# Patient Record
Sex: Male | Born: 1945 | Race: White | Hispanic: No | Smoking: Current every day smoker
Health system: Southern US, Community
[De-identification: ages and names within clinical notes are randomized; demographics above are authoritative.]

## PROBLEM LIST (undated history)

## (undated) DIAGNOSIS — J13 Pneumonia due to Streptococcus pneumoniae: Secondary | ICD-10-CM

## (undated) DIAGNOSIS — I10 Essential (primary) hypertension: Secondary | ICD-10-CM

## (undated) DIAGNOSIS — J40 Bronchitis, not specified as acute or chronic: Secondary | ICD-10-CM

## (undated) DIAGNOSIS — N179 Acute kidney failure, unspecified: Secondary | ICD-10-CM

## (undated) DIAGNOSIS — K219 Gastro-esophageal reflux disease without esophagitis: Secondary | ICD-10-CM

## (undated) DIAGNOSIS — K56609 Unspecified intestinal obstruction, unspecified as to partial versus complete obstruction: Secondary | ICD-10-CM

## (undated) DIAGNOSIS — J439 Emphysema, unspecified: Secondary | ICD-10-CM

## (undated) DIAGNOSIS — M1990 Unspecified osteoarthritis, unspecified site: Secondary | ICD-10-CM

## (undated) HISTORY — PX: NECK SURGERY: SHX720

## (undated) HISTORY — PX: INGUINAL HERNIA REPAIR: SUR1180

## (undated) HISTORY — DX: Emphysema, unspecified: J43.9

## (undated) HISTORY — PX: APPENDECTOMY: SHX54

---

## 1999-05-13 ENCOUNTER — Ambulatory Visit (HOSPITAL_BASED_OUTPATIENT_CLINIC_OR_DEPARTMENT_OTHER): Admission: RE | Admit: 1999-05-13 | Discharge: 1999-05-13 | Payer: Self-pay | Admitting: Surgery

## 2001-10-01 ENCOUNTER — Inpatient Hospital Stay (HOSPITAL_COMMUNITY): Admission: EM | Admit: 2001-10-01 | Discharge: 2001-10-04 | Payer: Self-pay | Admitting: Emergency Medicine

## 2001-10-01 ENCOUNTER — Encounter: Payer: Self-pay | Admitting: *Deleted

## 2001-10-03 ENCOUNTER — Encounter: Payer: Self-pay | Admitting: *Deleted

## 2006-07-18 ENCOUNTER — Emergency Department (HOSPITAL_COMMUNITY): Admission: EM | Admit: 2006-07-18 | Discharge: 2006-07-18 | Payer: Self-pay | Admitting: Emergency Medicine

## 2006-09-05 ENCOUNTER — Ambulatory Visit (HOSPITAL_COMMUNITY): Admission: RE | Admit: 2006-09-05 | Discharge: 2006-09-05 | Payer: Self-pay | Admitting: Surgery

## 2007-07-08 ENCOUNTER — Emergency Department (HOSPITAL_COMMUNITY): Admission: EM | Admit: 2007-07-08 | Discharge: 2007-07-08 | Payer: Self-pay | Admitting: Emergency Medicine

## 2007-07-12 ENCOUNTER — Ambulatory Visit (HOSPITAL_COMMUNITY): Admission: RE | Admit: 2007-07-12 | Discharge: 2007-07-12 | Payer: Self-pay | Admitting: Internal Medicine

## 2010-08-09 NOTE — Op Note (Signed)
Brent Good, Brent Good               ACCOUNT NO.:  [id number]   MEDICAL RECORD NO.:  [id number]          PATIENT TYPE:  AMB   LOCATION:  DAY                          FACILITY:  [facility]   PHYSICIAN:  Ardeth Sportsman, MD     DATE OF BIRTH:  1946/03/01   DATE OF PROCEDURE:  09/05/2006  DATE OF DISCHARGE:                               OPERATIVE REPORT   PRIMARY CARE PHYSICIAN:  Dr. Jacqulyn Bath at [facility].   REQUESTING PHYSICIAN:  Donnetta Hutching, MD.   PREOPERATIVE DIAGNOSIS:  Recurrent bilateral inguinal hernias.   POSTOPERATIVE DIAGNOSIS:  Recurrent bilateral inguinal hernias of right  pantaloon and left indirect.   PROCEDURE PERFORMED:  1. Laparoscopic lysis of adhesions x90 minutes (E=75% of the case).  2. Laparoscopic bilateral inguinal hernia repair with 15 x 15 cm      Parietex mesh, 15 x 15 cm for each side.   ANESTHESIA:  1. General anesthesia.  2. Local anesthetic and a field block around all port sites.  3. Bilateral ilioinguinal genitofemoral and cord nerve blocks.   ESTIMATED BLOOD LOSS:  15 ml.   COMPLICATIONS:  No major complications.   SPECIMENS:  None.   DRAINS:  None.   INDICATIONS:  Mr. Brent Good is a 65 year old male, who was seen urgently by  me at the request of Dr. Donnetta Hutching in the [facility] for severe  groin and scrotal pain.  He was found to have bilateral recurrent  inguinal hernias and a large left hydrocele.  Coordination was made with  Dr. Vernie Ammons in [facility], who recommended hydrocelectomy.   The anatomy and embryology abdominal formation testicular migration was  explained.  Past physiology of inguinal herniation was explained with  its risks of incarceration, strangulation, and decompensation to  physical activity.  Options were discussed, and recommendation was made  for a laparoscopic repair and exploration with bilateral inguinal hernia  repair.   Risks such as stroke, heart attack, DVT, thrombosis, pulmonary embolism,  and  death were discussed.  Risks such as bleeding, hematoma, ecchymosis,  need for transfusion, wound infection, abscess, injury to other organs,  testicular compromise of blood supply and/or loss, prolonged pain, nerve  injury, and other risks were discussed.  Questions were answered, and he  agreed to proceed.   OPERATIVE FINDINGS:  He had very dense intraabdominal wall adhesions  from probably prior mesh from his bilateral open inguinal hernia repairs  and his prior ruptured appendicitis surgery.  He had a pantaloon hernia  on the right (direct and indirect recurrent) and an indirect on the  left.  His bladder appeared to be otherwise normal.   DESCRIPTION OF PROCEDURE:  Informed consent was confirmed.  I actually  assisted Dr. Vernie Ammons with the left hydrocelectomy after the patient had  been intubated.  He had sequential compression devices active during the  entire case.  Near the end of the left hydrocelectomy, Dr. Vernie Ammons  placed a Foley catheter under sterile conditions to straight drain.  The  patient had already received IV antibiotics.   The patient was reprepped and draped on his  abdomen and groin in a  sterile fashion.   Entry was gained into the preperitoneal space through the infraumbilical  curvilinear incision.  Blunt dissection was done to help identify the  anterior rectus sheath.  A nick was made just to the right of midline  and the anterior rectus sheath.  In addition, an Hassan port was  attempted to be passed posterior to the right rectus abdominal muscle,  but unfortunately we got into the peritoneal cavity.  I could not get a  working good plane as it seemed to be rather scarred in.  Therefore, I  went in and did an approach from the left preperitoneal plane.  That was  slightly better.  Enough working space was created such that a 5-mm port  could be placed in the left mid abdomen and the right mid abdomen.  Later in the case, I ended up having to place a 5-mm  port in the right  upper quadrant through the peritoneum to help decompress that  capnoperitoneum to allow the capnopreperitoneum to insufflate.  Capnopreperitoneum to 15 mmHg provided pretty good intraabdominal wall  insufflation.   Attention was turned towards the right.  A controlled blunt in light of  focused sharp dissection was used hopefully to free the peritoneum off  the anterior abdominal wall.  The right rectus abdominis muscle was  rather scarred in, but I was able to get the peritoneum off rather  safely.  The peritoneum could be seen crawling up with the cord  structures into the internal inguinal ring.  He also had a large clump  of fat and omentum going into a defect medial to the internal inguinal  ring; this was a direct defect.  The direct hernia defect was  skeletonized and completed reduced.  The hernia sac going into the  internal ring was skeletonized off the cord structures and reduced.  There were some tears of the peritoneum during this process, and these  were closed using #3-0 Vicryl intracorporal laparoscopic suturing to  good effect.   Attention was turned toward the left side.  He had much more dense  adhesions on this left side.  His rectus abdominis muscle and his  inferior epigastric section were more adherent to the peritoneum, but  ultimately I was able, through careful dissection, to free it off the  peritoneum and reflect it anteriorly to help preserve it.  He had very  sharp, dense adhesions on the anterior wall of the hernia sac.  Based on  concerns of avoiding any other injury, I actually ended up  circumferentially dissecting the hernia sac off the normal-appearing  cord structures around the posterior wall of the hernia sac, and then  once that was completely freed out and was assured I did not have any  bowel or ureter or any other organs, I went ahead and sharply cut the  sac as I could not safely reduce it up into the preperitoneal plane.   He  had a ventral hernia on that side.  Once the hernia sac was transected  just before it entered into the internal inguinal ring, I was able to  free peritoneum off the cord structures very nicely posteriorly as well  as laterally.  The transected hernia sac defect was closed using  intracorporeal laparoscopic suturing with #3-0 Vicryl again.   A window was made between the anteriomedial bladder and the true pelvis  wall near the level of the obturator foramen on both sides, and the  peritoneum was freed off the vas deferens slightly as well, but attempt  made to avoid over skeletonization.   Parietex 15 x 15-cm mesh was cut to a half-skull shape for each side,  and each side had mesh placed such that a medial and inferior flap  rested in the true pelvis overlying the obturator foramen.  The mesh  laid well anteriorly, and the anteromedial portal went towards the  midline.  The mesh had at least three inches of circumferential coverage  around the internal inguinal rings bilaterally as well as the direct  hernia defects.  The peritoneum and the grasping lead points were  grasped and elevated anteriorly.  Capnoperitoneum and capnopreperitoneum  were evacuated.  Ports were removed.  The anterior rectus fascial  defects were closed using #0 Vicryl in figure-of-eight stitches.  The  skin was closed using #4-0 Monocryl stitch.  The Foley catheter was  removed.  The patient was extubated and sent to the recovery room in  stable condition.   I explained the operative findings to the patient's family,  postoperative recovery goals and etc. were discussed.  Numerous  questions were answered at least to their satisfaction, and they  expressed understanding and appreciation.  He will follow up with me in  a couple of weeks, sooner if there are any other issues.      Ardeth Sportsman, MD  Electronically Signed     SCG/MEDQ  D:  09/05/2006  T:  09/05/2006  Job:  [id number]   cc:   Donnetta Hutching, MD  [address] [place], [place] [address]

## 2010-08-09 NOTE — Op Note (Signed)
Brent Good, Brent Good               ACCOUNT NO.:  [id number]   MEDICAL RECORD NO.:  [id number]          PATIENT TYPE:  AMB   LOCATION:  DAY                          FACILITY:  [facility]   PHYSICIAN:  Mark C. Vernie Ammons, M.D.  DATE OF BIRTH:  02-22-1946   DATE OF PROCEDURE:  09/05/2006  DATE OF DISCHARGE:                               OPERATIVE REPORT   PREOPERATIVE DIAGNOSIS:  Left hydrocele.   POSTOPERATIVE DIAGNOSIS:  Left hydrocele.   PROCEDURE:  Left hydrocelectomy.   SURGEON:  Mark C. Vernie Ammons, M.D.   ASSISTANT:  Ardeth Sportsman, M.D.   ANESTHESIA:  General anesthesia.   SPECIMENS:  None.   ESTIMATED BLOOD LOSS:  Approximately 15 mL.   DRAINS:  A 16-French Foley catheter.   COMPLICATIONS:  None.   INDICATIONS FOR PROCEDURE:  The patient is a 65 year old white male  patient whom Dr. Michaell Cowing initially saw with an enlarged left hemiscrotum,  and was also found to have bilateral inguinal herniae.  He had a  previous hernia repair in 2001, and when he saw me in the office he was  noted to have a markedly enlarged left hemiscrotum.  A CT revealed that  this was a fluid collection only, no bowel.  I discussed with the  patient the procedure of the hydrocelectomy with its risks,  complications and alternatives.  He understands and elected to proceed.   DESCRIPTION OF PROCEDURE:  After an informed consent the patient brought  to the major operating room and was placed on the operating room table.  Administered general anesthesia.  His genitalia was then sterilely  prepped and draped.  I made a midline median raphe scrotal incision and  then carefully dissected the hydrocele sac from the surrounding tissues  circumferentially.  The hydrocele sac was then incised and 350+ mL of  clear amber fluid were drained from the left hemiscrotum.  I then opened  the sac and noted the testicle to appear normal.  The appendix  epididymis was then identified and fulgurated.  I then excised the  redundant parietal tunica and fulgurated the edges.  I then  reapproximated the edges behind the testicle with running locking #3-0  chromic suture.  The scrotum was visualized and no bleeding points were  noted.  I then closed the scrotum in two layers with a deep layer of  running #3-0 chromic, and the skin was then closed with running #3-0  chromic.  Neosporin, 4 x 4's and fluff Kerlix, as well as a scrotal  support were applied.  A #16 Foley catheter was then placed in the  bladder and hooked to closed system drainage.   The patient tolerated the procedure well.  There were no intraoperative  complications.   Dr. Michaell Cowing then proceeded with a laparoscopic bilateral inguinal  herniorrhaphy which will be dictated separately by him.   DISPOSITION:  I will see the patient back in my office in two weeks for  followup.  He will be given a prescription for Percocet by Dr. Michaell Cowing.      Mark C. Vernie Ammons, M.D.  Electronically Signed  MCO/MEDQ  D:  09/05/2006  T:  09/05/2006  Job:  [id number]   cc:   Ardeth Sportsman, MD  [address] [place] [address]

## 2010-08-12 NOTE — Consult Note (Signed)
[place]. [facility]  Patient:    Brent Good, Brent Good Visit Number: [id number] MRN: [id number]          Service Type: MED Location: [phone] Attending Physician:  Daisey Must Dictated by:   Oley Balm. Sung Amabile, M.D. [facility] Proc. Date: 10/01/01 Admit Date:  10/01/2001   CC:         Daisey Must, M.D. [facility]   Consultation Report  DATE OF BIRTH:  28-May-1945.  REQUESTING PHYSICIAN:  Daisey Must, M.D. [facility]  REASON FOR CONSULTATION:  Pleuritic chest pain and bilateral nodular densities and infiltrates.  HISTORY OF PRESENT ILLNESS:  The patient is a 65 year old gentleman who was in his usual state of good health until 3 days prior to this consultation.  On that Saturday night he developed sudden onset of left-sided pleuritic chest pain.  He also noted chills and shakes.  He documented no fevers.  He noted a rattling cough with white sputum and no hemoptysis.  He was seen that night in the emergency department at [facility] in [place], [place].  It sounds as if he was diagnosed with pleurisy and he was discharged home with ibuprofen and a muscle relaxant.  He was moderately better on the following day but symptoms relapsed on the day prior to this evaluation.  He was seen by a primary care physician, the name of whom he cannot remember.  This physician instructed him to continue the medications that had been prescribed in the emergency department previously.  The patient then presented on the evening of this consultation to the emergency department at [facility] with the above complaints.  He now states that the left-sided pleuritic chest pain has moved to across the midline and to the right side.  It is made worse by coughing and deep inspiration.  His cough is still just productive of white phlegm.  He was initially seen by Dr. Loraine Leriche Pulsipher due to the chest pain. However, Dr. Gerri Spore recognized that this did not represent  cardiac disease and ordered a CT scan of the chest, the results of which are discussed below. Based on the findings of this CT scan he has requested a pulmonary consultation.  PAST MEDICAL HISTORY:  Status post inguinal herniorrhaphy.  No history of major medical problems or major hospitalizations.  He takes no medications on a chronic basis.  SOCIAL HISTORY:  He has no significant occupational or environmental exposures.  He smokes a pack of cigarettes per day and has done so since approximately age 38.  FAMILY HISTORY:  No major illnesses run in his family.  His mother does suffer from Alzheimers disease.  His father is deceased from a self-inflicted gunshot wound.  He has two half sisters who are alive and well.  REVIEW OF SYSTEMS:  This is as per the history of present illness.  He has also had poor appetite over the last 3 days.  He denies significant weight loss.  There is no nausea, vomiting, diarrhea, or dysuria.  There is no hematochezia or melena.  No abdominal pain.  PHYSICAL EXAMINATION:  GENERAL:  He is well-developed, well-nourished, and acutely ill appearing but no overt cardiac or respiratory distress.  He does cough and wince with each deep cough.  VITAL SIGNS:  Temperature is 99.2, blood pressure 126/83, pulse 103, respirations 20, oxygen saturations 93% on room air.  HEENT:  No acute abnormalities.  NECK:  Supple without adenopathy or jugular venous distention.  CHEST:  Few  scattered wheezes and a few rhonchi.  A pleural friction rub is noted in the right base.  No findings of consolidation are seen.  CARDIAC:  Regular rate and rhythm without murmurs.  ABDOMEN:  Soft with normal bowel sounds.  No palpable masses.  EXTREMITIES:  Without clubbing, cyanosis, and edema.  NEUROLOGIC:  No focal deficits.  DATA:  Chest x-ray reveals poor inspiration with what appears to be some bilateral atelectasis.  CT scan of the chest demonstrates multiple  irregular nodular densities.  Most of these are pleural based.  There is a density in the posterior segment of the right upper lobe.  A density in the left upper lobe laterally.  This is abutting the pleura.  There are two densities in the superior segment of the right lower lobe, one oblong and one rounded.  The rounded one is abutting the pleura.  There are two densities low down in the posterior costophrenic angles bilaterally.  The one on the right has more of an infiltrative pneumonic appearance.  There is also some scattered atelectasis in the bases.  No significant adenopathy is noted.  IMPRESSION: 1. Acute onset of pleuritic chest pain 3 days prior to this consultation with    subjective fever and chills. 2. Multiple nodular densities/infiltrates - the radiographic appearance would    be worrisome for the possibility of metastatic carcinoma.  However, the    history really is not suggestive of this with such an abrupt illness of    only 3 days duration.  Therefore, for now I am going to presume that this    is an atypical radiographic appearance of a bacterial pneumonia and I have    formulated the following plan.  In making this assessment, the patient    understands that we might be dealing with pneumonia but we might also be    dealing with metastatic carcinoma.  PLAN: 1. Tequin 400 mg per day for 10 days. 2. Ibuprofen 800 mg t.i.d. until pain is gone. 3. Percocet one to two p.o. q.4-6h. p.r.n. for breakthrough pain. 4. Prilosec 40 mg daily while on the ibuprofen. 5. He is to call my office on the day following this consultation to arrange    followup with my nurse practitioner in 1 week.  A chest x-ray will be    obtained at that time.  If his symptoms are not substantially better by    that time a biopsy will have to be undertaken.  Most likely we will arrange    for transthoracic needle aspiration of one of these peripheral nodular    densities. 6. If he is feeling  better next week then he will follow up with me in the     office in 4 weeks with a repeat CT scan of the chest. Dictated by:   Oley Balm. Sung Amabile, M.D. [facility] Attending Physician:  Daisey Must DD:  10/01/01 TD:  10/02/01 Job: [id number]

## 2010-08-12 NOTE — Discharge Summary (Signed)
[place]. [facility]  Patient:    Brent Good, Brent Good Visit Number: [id number] MRN: [id number]          Service Type: MED Location: [phone] Attending Physician:  Merwyn Katos Dictated by:   Oley Balm Sung Amabile, M.D. [facility] Admit Date:  10/01/2001 Discharge Date: 10/04/2001   CC:         Daisey Must, M.D. [facility]   Discharge Summary  DATE OF BIRTH: 1945-04-26  ADMISSION DIAGNOSES: 1. Pleuritic chest pain with bilateral nodular densities, probable    pneumonia. 2. Atypical radiographic appearance for pneumonia, concern for malignancy. 3. Smoker.  DISCHARGE DIAGNOSES: 1. Probable pneumonia. 2. Smoker.  HISTORY OF PRESENT ILLNESS: Please refer to the admission H&P for this patients initial presentation. Briefly, he is a 65 year old gentleman who was excellent health until three days prior to admission when he developed left sided pleuritic chest pain with chills and shakes. He was seen by two physicians during the three days prior to admission and diagnosed with pleurisy. He was treated with Ibuprofen and a muscle relaxant. He presented to the emergency room on October 01, 2001 at [facility] with severe pleuritic chest pain and low grade fever. A CT scan of the chest was performed and demonstrated bilateral nodular densities. Please see the H&P as well as the radiographic report for details of this. The radiologist was quite concerned about the possibility, even likelihood, of metastatic cancer. However, I do not believe that his history of suggestive of this and was more consistent with pneumonia. Therefore, I formulated a plan to treat this as pneumonia with pleuritic chest pain. Initially, the patient was going to be sent home but he felt that his chest pain was so severe that he required hospitalization. Therefore, he was admitted for IV antibiotics and analgesia.  HOSPITAL COURSE: His hospital course was uncomplicated. He was treated  with ceftriaxone and Azithromycin. He was treated with Toradol and Percocet as needed for pain. He was put empirically on nebulized bronchodilators. He was counseled regarding smoking cessation throughout the course of his hospitalization. He did have fevers up to 101.8 during his hospitalization. A repeat chest x-ray on the day prior to discharge did demonstrate increased size and density of the bilateral nodular infiltrates. On the right side, there appeared to be an area of atelectasis in the right mid lung. At this point, it is believed that most likely we are dealing with a bacterial pneumonia with a very atypical radiographic appearance. However, given the atypical appearance and the interpretation made by the radiologist, we cannot eliminate the possibility that this might indeed represent metastatic cancer. Therefore, arrangements are made now for him to be discharged to complete a course of antibiotics. He will return for repeat CT scan in four weeks. He will follow-up with me in the office after the CT scan is completed.  DISCHARGE MEDICATIONS: 1. Tequin 400 mg per day times seven days. 2. Percocet one to two p.o. q.4-6h. p.r.n. pain.  FOLLOW-UP: He is going to take the next week off work. A work excuse has been written. He will follow-up with Dr. Sung Amabile in four weeks, after CT scan has been repeated. Also, I have counseled him extensively regarding the need for smoking cessation. Dictated by:   Oley Balm Sung Amabile, M.D. [facility] Attending Physician:  Merwyn Katos DD:  10/04/01 TD:  10/07/01 Job: [id number]

## 2010-08-12 NOTE — Op Note (Signed)
[place]. [facility]  Patient:    Brent Good, Brent Good                     MRN: [id number] Proc. Date: 05/13/99 Adm. Date:  [id number] Attending:  Abigail Miyamoto A                           Operative Report  PREOPERATIVE DIAGNOSIS:  Bilateral inguinal hernias.  POSTOPERATIVE DIAGNOSIS:  Bilateral inguinal hernias.  PROCEDURE:  Bilateral laparoscopic inguinal hernia repair with mesh.  SURGEON:  Abigail Miyamoto, M.D.  ANESTHESIA:  General endotracheal anesthesia and 0.25% Marcaine plain.  ESTIMATED BLOOD LOSS:  Minimal.  PROCEDURE IN DETAIL:  The patient was brought to the operating room and identified as Brent Good.  He was placed supine on the operating room table, and general anesthesia was induced.  His abdomen was then prepped and draped in the usual sterile fashion.  Using a #15 blade, a small transverse incision was made just below the umbilicus.  The incision was carried down to the abdominal fascia bluntly with a hemostat.  The fascia was then entered just to the right of the midline.  The rectus muscle was then identified and elevated.  The balloon dissector was hen passed underneath the rectus sheath and manipulated down to the pubis.  The dissecting balloon was then thoroughly insufflated.  This allowed dissection of the preperitoneal space.  The balloon was left inflated several minutes to allow for hemostasis.  Next, the balloon was deflated and removed, and then the balloon origin port was placed through the incision, and insufflation of the preperitoneal space was begun.  Next, two separate incisions were made in the midline, and two 5 mm ports were placed in the midline under direct vision.  Dissection was then  begun in the right inguinal area.  The patient appeared to have a large, direct, inguinal defect.  The cord and its structures appeared normal without indirect hernia.  Next, dissection was carried out on the left  groin.  Again, the testicular cord and its structures were identified, along with a direct hernia defect.  At  this point, two separate pieces of Prolene mesh were brought onto the field and  fashioned appropriately.  Each piece of mesh was then placed through the umbilical port and then tacked in place over each inguinal area in an onlay technique, tacking to the Cooper ligament and up around the abdominal wall and laterally.  Each piece of mesh covered the defect and cord structures well, along with overlap in the midline.  Hemostasis appeared to be achieved.  Again, good coverage of the hernia defects appeared to be achieved.  At this point, both 5 mm ports were removed, and the abdomen was deflated.  As the preperitoneal space shrank, the esh appeared to lay in place nicely.  At this point, the port was removed from the umbilicus.  The fascia and umbilicus were then closed with a purse-string #0 Vicryl suture.  All incisions were then anesthetized with 0.25% Marcaine and  then closed with 4-0 Vicryl sutures.  Steri-Strips and band-aids were then applied. The patient tolerated the procedure well.  All sponge, needle, and instrument counts were correct at the end of the procedure.  The patient was then extubated in the operating room and taken in stable condition to the recovery room. DD:  05/13/99 TD:  05/13/99 Job: [id number]

## 2010-08-12 NOTE — Consult Note (Signed)
[place]. [facility]  Patient:    Brent Good, Brent Good Visit Number: [id number] MRN: [id number]          Service Type: MED Location: [id number] Attending Physician:  Merwyn Katos Dictated by:   Daisey Must, M.D. [facility] Proc. Date: 10/01/01 Admit Date:  10/01/2001 Discharge Date: 10/04/2001   CC:         Paulita Cradle, N.P., [facility]   Consultation Report  CHIEF COMPLAINT:  Chest pain and shortness of breath.  HISTORY OF PRESENT ILLNESS:  The patient is a 65 year old male without prior cardiac or significant medical history.  He does have a longstanding history of tobacco abuse.  On Saturday which was 4 days ago the patient developed the acute onset of left-sided pleuritic type chest pain.  It is located below the left rib cage and radiates into the right side below the right rib cage.  It is described as a sharp stabbing pain which is worse with deep breath.  The is no change with activity, eating or body position.  The patient states the pain has been continuous since then.  He has also been short of breath which gets worse with activity.  He does give a history of previous pneumoniae which have felt quite similar to this episode.  He was actually evaluated in the emergency room at [facility] on Saturday where he was diagnosed with chest wall pain and treated with nonsteroidal anti-inflammatory agents and muscle relaxants.  These medications have helped the pain somewhat, however, have not relieved it completely. Today the pain became worse and he therefore presented to the office at [facility] and was subsequently transferred via EMS to [facility].  The patient states he was in his usual state of health prior to Saturday.  He had no prior history of chest pain or shortness of breath.  His cardiac risk factors are positive for tobacco abuse.  He denies a history of  hypertension, diabetes, and does not know his cholesterol level and denies a family history of premature coronary artery disease.  PAST MEDICAL HISTORY/ MEDICATIONS/ SOCIAL HISTORY/ FAMILY HISTORY AND REVIEW OF SYSTEMS:  As noted in the physician assistant handwritten admission note.  PHYSICAL EXAMINATION:  GENERAL:  This is a well-appearing middle aged male in mild discomfort.  VITAL SIGNS:  Temperature 98.3, pulse 104 and regular. Respirations 24, blood pressure 125/83.  Oxygen saturation on 4 liters nasal cannula 98%.  SKIN: Warm and dry without generalized rash.  HEENT:  Normocephalic and atraumatic, sclerae anicteric.  Eyelids are normal. Oral mucosa is unremarkable.  NECK:  Reveals no adenopathy or thyromegaly.  No JVD. Carotid upstroke is normal without bruits.  CHEST:  Clear to percussion with bibasilar inspiratory crackles.  CARDIAC:  Apical and pulse non-displaced.  Tachycardic, regular rate and rhythm, normal S1, S2 without rub, murmur or S3.  ABDOMEN: Soft and nontender without organomegaly.  No bruits.  Normal bowel sounds.  EXTREMITIES:  No clubbing, cyanosis, or edema.  Peripheral pulses are 2+ throughout.  EKG: Reveals sinus tachycardia at a rate of 105 otherwise normal.  CHEST X-RAY:  Reveals decreased lung volumes and left basilar atelectasis.  No obvious infiltrate.  OTHER LABORATORY DATA:  As noted in the handwritten admission note.  Of note, his D-dimer is elevated at 1.30.  ASSESSMENT AND PLAN: 1. ACUTE PLEURITIC CHEST PAIN:  Of note, review and x-ray report from [facility] suggested the possibility of  pulmonary hypertension with    enlargement of his central pulmonary arteries more on the left than the    right.  This coupled with the nature of the patients chest pain, shortness    of breath, and elevated D-dimer suggest the possibility of acute pulmonary    embolism.  The patient will therefore be sent urgently for a spiral CT scan     to rule out pulmonary embolism.  If this does confirm the diagnosis he will    be started on anticoagulation.  Other possibilities include early    pneumonia, viral pleuritis or pericarditis.  I doubt myocardial ischemia,    however, this should be ruled out as well.  The patient will therefore be    admitted after the CT scan is obtained and with treatment dictated by the    findings of the CT scan.  2. ONGOING TOBACCO ABUSE:  The patient will be counseled on the importance and    necessity of discontinuation of tobacco.  3. COUGH WITH PLEURITIC CHEST PAIN, SHORTNESS OF BREATH AND ELEVATED WHITE    COUNT.  AS described above also rule out pneumonia.  Will consider    obtaining blood cultures and if there is any evidence of bacterial    pneumonia the patient will be started on appropriate antibiotics. Dictated by:   Daisey Must, M.D. [facility] Attending Physician:  Merwyn Katos DD:  10/01/01 TD:  10/04/01 Job: [id number]

## 2010-08-12 NOTE — Consult Note (Signed)
NAMEKYLLE, LALL NO.:  [id number]   MEDICAL RECORD NO.:  [id number]          PATIENT TYPE:  EMS   LOCATION:  ED                           FACILITY:  [facility]   PHYSICIAN:  Ardeth Sportsman, MD     DATE OF BIRTH:  1946/02/09   DATE OF CONSULTATION:  DATE OF DISCHARGE:                                 CONSULTATION   PRIMARY CARE PHYSICIAN:  Dr. Jacqulyn Bath at [facility].   REQUESTING PHYSICIAN:  Donnetta Hutching, M.D.   SURGEON:  Ardeth Sportsman, M.D.   UROLOGIST:  [facility], Dr. Isabel Caprice.   REASON FOR CONSULTATION:  Left scrotal mass, concern for incarcerated  nonreducible left inguinal hernia.  Recurrent left inguinal hernia.   HISTORY OF PRESENT ILLNESS:  Mr. Stejskal is a 65 year old male who has a  history of bilateral inguinal hernias back when he was a teenager.  He  ultimately had those repaired in 2001, as it became a little larger and  more symptomatic by Dr. Magnus Ivan.  It was done bilateral open with mesh.  He has occasional discomfort in his groin, which has been relatively  mild; however, he has always had a little bit of left groin and scrotal  swelling.  It has gradually increased but over the last 6-8 months, it  has gotten markedly larger.  It has not been particularly painful, but  it is uncomfortable with it hanging down, given its weight and when he  bends over.  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   He denies any nausea or vomiting.  No fevers, chills, or sweats.  He  normally has a bowel movement every day with no constipation or  diarrhea.  No dysuria or difficulty with straining.  No chronic cough,  although he is a heavy smoker.   He went and saw his primary care physician today and based on concerns  of the size and the fact that they cannot reduce it, our office were  called.  He was presented as a nonreducible hernia and based on  concerns, he felt the safest  thing to do was have him evaluated.  He  came to [facility] emergency room.  He says that his symptoms have  really not changed markedly over the past few months.  It is always out,  and he has never been able to reduce it.  He is able to urinate without  too much difficulty.  No recent history of urinary tract infections.   PAST MEDICAL HISTORY:  1. Mild gastroesophageal reflux disease.  He apparently was on Nexium      for 30 days.  He has never had an upper endoscopy.  2. Negative colonoscopy about 5-10 years ago.  3. Tobacco abuse.   PAST SURGICAL HISTORY:  He had an open repair of a ruptured appendicitis  in 1980.  He had an open bilateral inguinal hernia repair in 2001 by Dr.  Magnus Ivan, formerly of [facility].   ALLERGIES:  None.  MEDICATIONS:  He takes Zantac p.r.n.  Advil p.r.n.  Tylenol p.r.n.   SOCIAL HISTORY:  He has probably about a 50-pack-year history of  tobacco.  He currently smokes a pack a day.  He has smoked since he was  65 years old.  He has maybe a six pack a week of beer.  No other drug  use.  Again, he works in a [employer] officer, environmental, doing a fair amount of heavy  physical activity.   FAMILY HISTORY:  Negative for any major GI or cardiac disorders.   REVIEW OF SYSTEMS:  Notable per HPI.  CONSTITUTIONAL:  No fevers,  chills, sweats, no weight gain or weight loss.  EYES:  No vision or  sight changes.  No recent conjunctivitis.  ENT:  No nasal discharge,  hearing problems, or hearing changes.  CARDIAC:  He is very active with  no exertional chest pain or claudication.  PULMONARY:  No shortness of  breath.  No recent cold, coughs, or flu.  GI:  Notable as per HPI.  No  hematochezia or melena.  No hematemesis.  NEUROLOGIC:  No dysuria or  pyuria.  No difficulty straining.  HEME/LYMPH:  No history of any anemia  or lymphatic problems.  ALLERGIES:  No allergic problems or skin  reactions.  Hepatic, renal, endocrine, dermatologic, psychiatric,   neurological, musculoskeletal, otherwise negative.   PHYSICAL EXAMINATION:  VITAL SIGNS:  His temperature is 98.6, pulse 99,  respirations 22, blood pressure 144/86.  Pain 0/10.  Sats 98% on room  air.  He is about 5 feet, 9 inches with a BMI around 30.  GENERAL:  He is a well-developed, well-nourished, overly obese male in  no acute distress.  HEENT:  Pupils are equal, round and reactive to light.  Extraocular  movements are intact.  Sclerae are anicteric or injected.  HEENT is  normocephalic with no facial asymmetry.  Mucous membranes are moist.  Nasopharynx and oropharynx are clear.  NECK:  Supple without any masses.  Trachea is midline.  LYMPH:  No head, neck, axillary, or groin adenopathy.  PSYCH:  He is pleasant and active with no evidence of any dementia,  delirium, psychosis, or paranoia.  LUNGS:  Clear to auscultation bilaterally with no wheezes, rales or  rhonchi.  No pain on sternal compression.  HEART:  Regular rate and rhythm with no murmurs, clicks, or rubs.  No  carotid bruits.  Normal radial and dorsalis pedis pulses.  ABDOMEN:  Obese but soft.  He might have a small periumbilical hernia.  He has a well-healed appendiceal incision.  He is not distended.  No  evidence of any peritonitis.  GENITAL:  He is an uncircumcised male.  His testes appear to be of  normal size, and on the right he has a moderate-sized inguinal hernia  with Valsalva that spontaneously reduces.  It can really only be  detected with standing.  On the left, he has a more subtle but definite  one as well.  His left scrotum has a very large, I would say, 12 x 12 cm  circular mass that is not particularly painful, nor is it reducible.  It  does not involve the cords and does not go up into his inguinal ring.  RECTAL:  Deferred.  EXTREMITIES:  No clubbing or cyanosis.  MUSCULOSKELETAL:  Full range of motion of the shoulders, elbows, wrists,  as well as hips, knees, and ankles. SKIN:  No obvious  petechiae, sores, or lesions.   LABORATORY VALUES:  None.   ULTRASOUND:  He had a scrotal ultrasound, which shows his testes  descended bilaterally with normal perfusion.  He has a large fluid-  filled mass in his left scrotum and does not seem to have peristalsis.  It is not consistent with bowel or anything solid.  It seems most  consistent with a hydrocele.   ASSESSMENT/PLAN:  A 65 year old male with recurrent right greater than  left subtle inguinal hernias and a large left scrotal hydrocele.   There is no surgical emergency at this time.  I think he could benefit  from elective repair.  I do these laparoscopically.  The technique and  options were discussed.  The anatomy and embryology of abdominal  formation and testicular migration was explained.  The pathophysiology  of inguinal herniation with its natural history of increasing size and  risks of incarceration and strangulation were explained.  The technique  of laparoscopic bilateral inguinal exploration and hernia repair with  mesh was discussed.  Risks such as stroke, heart attack, deep venous  thrombosis, pulmonary embolus, or death are discussed.  Risks such as  bleeding, need for transfusion, ecchymosis, wound infection, abscess,  injury to other organs, hernia recurrence, and other risks were  discussed.  Questions were answered, and he agrees to proceed with [place] at  this time.   Large left scrotal hydrocele.  He probably should have that repaired as  well, given the fact it is a decent size.  I typically do not do those  but defer to neurology on this, although I think it is within the  technical means to do this.  I have left a note to try and get hold of  [facility] and see if they could do this at the same time.   He will call our offices at a later time to coordinate this, and I will  have him call [facility] as well if he wishes to have it done in  [place].      Ardeth Sportsman, MD   Electronically Signed     SCG/MEDQ  D:  07/18/2006  T:  07/18/2006  Job:  [phone]

## 2010-08-12 NOTE — Consult Note (Signed)
[place]. [facility]  Patient:    GRAYDEN, BURLEY Visit Number: [id number] MRN: [id number]          Service Type: MED Location: 5500 [id number] Attending Physician:  Merwyn Katos Dictated by:   Daisey Must, M.D. [facility] Proc. Date: 10/01/01 Admit Date:  10/01/2001 Discharge Date: 10/04/2001                            Consultation Report  ADDENDUM:  A spiral CT scan was obtained.  This revealed a pulmonary parenchymal mass with bilateral pleural based masses which was suspicious for primary lung and cancer, question adenocarcinoma with pleural metastases.  Dr. Sung Amabile from pulmonary medicine has been consulted and will assess the patient and determine subsequent disposition. Dictated by:   Daisey Must, M.D. [facility] Attending Physician:  Merwyn Katos DD:  10/01/01 TD:  10/04/01 Job: [id number]

## 2011-01-12 LAB — HEMOGLOBIN AND HEMATOCRIT, BLOOD
HCT: 47.5
Hemoglobin: 16.3

## 2011-03-04 ENCOUNTER — Emergency Department (INDEPENDENT_AMBULATORY_CARE_PROVIDER_SITE_OTHER)
Admission: EM | Admit: 2011-03-04 | Discharge: 2011-03-04 | Disposition: A | Discharge status: Home / Self Care | Payer: Medicare Other | Source: Home / Self Care

## 2011-03-04 DIAGNOSIS — R197 Diarrhea, unspecified: Secondary | ICD-10-CM

## 2011-03-04 HISTORY — DX: Pneumonia due to Streptococcus pneumoniae: J13

## 2011-03-04 MED ORDER — DIPHENOXYLATE-ATROPINE 2.5-0.025 MG PO TABS: 1.0000 | ORAL_TABLET | Freq: Four times a day (QID) | ORAL | Status: DC | PRN

## 2011-03-04 NOTE — ED Provider Notes (Signed)
History     CSN: [id number] Arrival date & time: 03/04/2011 11:15 AM   None     Chief Complaint  Patient presents with  . Cough    Pt has cough, congestion and chills since Monday    (Consider location/radiation/quality/duration/timing/severity/associated sxs/prior treatment) HPI Comments: Pt with flu-like sx starting 12/3 (fever, chills, congestion, aches, sore throat) that have largely resolved.  Began having diarrhea on 12/5, loose, after every meal.  Took "pink medicine" from over the counter x1 yesterday and it helped relieve diarrhea temporarily; did not take again. Has not altered diet.   Patient is a 65 y.o. male presenting with diarrhea. The history is provided by the patient.  Diarrhea The primary symptoms include diarrhea. Primary symptoms do not include fever, abdominal pain, nausea, vomiting or melena. The illness began 3 to 5 days ago. The onset was sudden. The problem has not changed since onset. The illness does not include chills, anorexia or bloating.    Past Medical History  Diagnosis Date  . Pneumonia, pneumococcal     Past Surgical History  Procedure Date  . Inguinal hernia repair     History reviewed. No pertinent family history.  History  Substance Use Topics  . Smoking status: Current Everyday Smoker -- 1.0 packs/day    Types: Cigarettes  . Smokeless tobacco: Not on file  . Alcohol Use: No      Review of Systems  Constitutional: Negative for fever and chills.  Gastrointestinal: Positive for diarrhea. Negative for nausea, vomiting, abdominal pain, blood in stool, melena, abdominal distention, bloating and anorexia.  Neurological: Negative for weakness.    Allergies  Review of patient's allergies indicates no known allergies.  Home Medications   Current Outpatient Rx  Name Route Sig Dispense Refill  . NAPROXEN SODIUM 220 MG PO TABS Oral Take 220 mg by mouth 2 (two) times daily with a meal.      . ALKA-SELTZER PLS NIGHT CLD/FLU PO Oral  Take by mouth.      Marland Kitchen DIPHENOXYLATE-ATROPINE 2.5-0.025 MG PO TABS Oral Take 1-2 tablets by mouth 4 (four) times daily as needed for diarrhea or loose stools (maximum dose is 8 tablets per day). 30 tablet 0    BP 112/70  Pulse 92  Temp(Src) 98.5 F (36.9 C) (Oral)  Resp 16  SpO2 98%  Physical Exam  Constitutional: He appears well-developed and well-nourished. No distress.  Cardiovascular: Normal rate and regular rhythm.   Pulmonary/Chest: Effort normal and breath sounds normal.  Abdominal: Soft. Normal appearance and bowel sounds are normal. He exhibits no distension and no mass. There is no tenderness. There is no CVA tenderness.    ED Course  Procedures (including critical care time)  Labs Reviewed - No data to display No results found.   1. Diarrhea     Although pt initially presented with flu/cold sx, he states his main reason for coming is diarrhea and that the other sx are resolving and aren't bothering him  MDM          Cathlyn Parsons, NP 03/04/11 1159

## 2011-03-04 NOTE — ED Provider Notes (Signed)
Medical screening examination/treatment/procedure(s) were performed by non-physician practitioner and as supervising physician I was immediately available for consultation/collaboration.  Raynald Blend, MD 03/04/11 [phone]

## 2011-03-06 ENCOUNTER — Encounter (HOSPITAL_COMMUNITY): Payer: Self-pay | Admitting: *Deleted

## 2011-03-06 ENCOUNTER — Emergency Department (HOSPITAL_COMMUNITY)
Admission: EM | Admit: 2011-03-06 | Discharge: 2011-03-06 | Disposition: A | Discharge status: Home / Self Care | Payer: Medicare Other | Attending: Emergency Medicine | Admitting: Emergency Medicine

## 2011-03-06 ENCOUNTER — Emergency Department (HOSPITAL_COMMUNITY): Payer: Medicare Other

## 2011-03-06 DIAGNOSIS — R059 Cough, unspecified: Secondary | ICD-10-CM | POA: Insufficient documentation

## 2011-03-06 DIAGNOSIS — Z79899 Other long term (current) drug therapy: Secondary | ICD-10-CM | POA: Insufficient documentation

## 2011-03-06 DIAGNOSIS — B349 Viral infection, unspecified: Secondary | ICD-10-CM

## 2011-03-06 DIAGNOSIS — R0602 Shortness of breath: Secondary | ICD-10-CM | POA: Insufficient documentation

## 2011-03-06 DIAGNOSIS — J40 Bronchitis, not specified as acute or chronic: Secondary | ICD-10-CM | POA: Insufficient documentation

## 2011-03-06 DIAGNOSIS — R197 Diarrhea, unspecified: Secondary | ICD-10-CM | POA: Insufficient documentation

## 2011-03-06 DIAGNOSIS — R509 Fever, unspecified: Secondary | ICD-10-CM | POA: Insufficient documentation

## 2011-03-06 DIAGNOSIS — B9789 Other viral agents as the cause of diseases classified elsewhere: Secondary | ICD-10-CM | POA: Insufficient documentation

## 2011-03-06 DIAGNOSIS — K219 Gastro-esophageal reflux disease without esophagitis: Secondary | ICD-10-CM | POA: Insufficient documentation

## 2011-03-06 DIAGNOSIS — R062 Wheezing: Secondary | ICD-10-CM | POA: Insufficient documentation

## 2011-03-06 DIAGNOSIS — R05 Cough: Secondary | ICD-10-CM | POA: Insufficient documentation

## 2011-03-06 DIAGNOSIS — F17200 Nicotine dependence, unspecified, uncomplicated: Secondary | ICD-10-CM | POA: Insufficient documentation

## 2011-03-06 HISTORY — DX: Gastro-esophageal reflux disease without esophagitis: K21.9

## 2011-03-06 HISTORY — DX: Bronchitis, not specified as acute or chronic: J40

## 2011-03-06 MED ORDER — IPRATROPIUM BROMIDE 0.02 % IN SOLN
0.5000 mg | Freq: Once | RESPIRATORY_TRACT | Status: AC
  Administered 2011-03-06: 0.5 mg via RESPIRATORY_TRACT
  Filled 2011-03-06: qty 2.5

## 2011-03-06 MED ORDER — ALBUTEROL SULFATE HFA 108 (90 BASE) MCG/ACT IN AERS
2.0000 | INHALATION_SPRAY | RESPIRATORY_TRACT | Status: DC | PRN
  Administered 2011-03-06: 2 via RESPIRATORY_TRACT
  Filled 2011-03-06: qty 6.7

## 2011-03-06 MED ORDER — AZITHROMYCIN 250 MG PO TABS: 250.0000 mg | ORAL_TABLET | Freq: Every day | ORAL | Status: AC

## 2011-03-06 MED ORDER — ALBUTEROL SULFATE (5 MG/ML) 0.5% IN NEBU
5.0000 mg | INHALATION_SOLUTION | Freq: Once | RESPIRATORY_TRACT | Status: AC
  Administered 2011-03-06: 5 mg via RESPIRATORY_TRACT
  Filled 2011-03-06: qty 1

## 2011-03-06 MED ORDER — HYDROCOD POLST-CHLORPHEN POLST 10-8 MG/5ML PO LQCR: 5.0000 mL | Freq: Two times a day (BID) | ORAL | Status: DC | PRN

## 2011-03-06 MED ORDER — HYDROCOD POLST-CHLORPHEN POLST 10-8 MG/5ML PO LQCR
5.0000 mL | Freq: Once | ORAL | Status: AC
  Administered 2011-03-06: 5 mL via ORAL
  Filled 2011-03-06: qty 5

## 2011-03-06 NOTE — ED Notes (Signed)
Pulse rate 90 O2 sat 100%

## 2011-03-06 NOTE — ED Provider Notes (Signed)
History     CSN: [id number] Arrival date & time: 03/06/2011  5:09 PM   First MD Initiated Contact with Patient 03/06/11 2042      Chief Complaint  Patient presents with  . Shortness of Breath     Patient is a 65 y.o. male presenting with cough. The history is provided by the patient.  Cough This is a new problem. The current episode started more than 1 week ago. The problem occurs every few minutes. The problem has been gradually worsening. The cough is non-productive. Associated symptoms include chills, shortness of breath and wheezing.  Reports persistent dry cough for greater than 1 week. States he coughs so hard at times he can not catch his breath. States also having chills and fever (has not taken at home) and several episodes of diarrhea in the last 24 and hours. Pt states his wife has recently had similar symptoms, though milder. Pt is in the history is in the past.   Past Medical History  Diagnosis Date  . Pneumonia, pneumococcal   . Bronchitis   . GERD (gastroesophageal reflux disease)     Past Surgical History  Procedure Date  . Inguinal hernia repair     History reviewed. No pertinent family history.  History  Substance Use Topics  . Smoking status: Current Everyday Smoker -- 1.0 packs/day    Types: Cigarettes  . Smokeless tobacco: Not on file  . Alcohol Use: No      Review of Systems  Constitutional: Positive for chills.  HENT: Negative.   Eyes: Negative.   Respiratory: Positive for cough, shortness of breath and wheezing.   Cardiovascular: Negative.   Gastrointestinal: Negative.   Genitourinary: Negative.   Musculoskeletal: Negative.   Skin: Negative.   Neurological: Negative.   Hematological: Negative.   Psychiatric/Behavioral: Negative.     Allergies  Review of patient's allergies indicates no known allergies.  Home Medications   Current Outpatient Rx  Name Route Sig Dispense Refill  . NAPROXEN SODIUM 220 MG PO TABS Oral Take 220 mg by  mouth 2 (two) times daily with a meal.      . RANITIDINE HCL 150 MG PO TABS Oral Take 150 mg by mouth 2 (two) times daily.        BP 128/84  Pulse 97  Temp(Src) 99.4 F (37.4 C) (Oral)  Resp 22  SpO2 96%  Physical Exam  Constitutional: He appears well-developed and well-nourished.  HENT:  Head: Normocephalic and atraumatic.  Eyes: Conjunctivae are normal.  Neck: Neck supple.  Cardiovascular: Normal rate and regular rhythm.   Pulmonary/Chest: Effort normal. He has wheezes.       Frequent dry cough w/ insp/exp wheezes.  Abdominal: Soft. Bowel sounds are normal.  Musculoskeletal: Normal range of motion.  Neurological: He is alert.  Skin: Skin is warm and dry.  Psychiatric: He has a normal mood and affect.    ED Course  Procedures Pt states cough much improved after neb. (BBS improved w/ only occasional exp wheeze)s. Findings discussed w/ pt and plan for d/c home w/ tx for Bronchitis. Discussed benefits of smoking cessation. Pt is agreeable w/ plan.   Labs Reviewed  I-STAT, CHEM 8   Dg Chest 2 View  03/06/2011  *RADIOLOGY REPORT*  Clinical Data: Shortness of breath, nonproductive cough, fever, history of smoking, pneumonia  CHEST - 2 VIEW  Comparison: 09/05/2006  Findings: Normal heart size, mediastinal contours, and pulmonary vascularity. Emphysematous and chronic bronchitic changes. No pulmonary infiltrate, pleural effusion, or  pneumothorax. Bones unremarkable.  IMPRESSION: Emphysematous and chronic bronchitic changes. No acute abnormalities.  Original Report Authenticated By: Lollie Marrow, M.D.     No diagnosis found.    MDM  HPI, PMhx, PE and clinical findings c/w Bronchitis.  More acute processes considered such as PNA and PE. CXR w/o acute findings, pt is not tachycardic and 02 sats at 100 on r/a after neb.   Leanne Chang, NP 03/06/11 2215

## 2011-03-06 NOTE — ED Notes (Signed)
Instructions given on use of inhaler.  Voiced understanding

## 2011-03-06 NOTE — ED Notes (Signed)
He has had a cold and cough with chills diarrhea elevated temp and sob since last tuesday

## 2011-03-07 NOTE — ED Provider Notes (Signed)
Evaluation and management procedures were performed by the PA/NP under my supervision/collaboration.   Dione Booze, MD 03/07/11 [phone]

## 2011-04-11 ENCOUNTER — Institutional Professional Consult (permissible substitution): Payer: Medicare Other | Admitting: Internal Medicine

## 2011-04-19 ENCOUNTER — Institutional Professional Consult (permissible substitution): Payer: Medicare Other | Admitting: Cardiology

## 2011-06-23 DIAGNOSIS — R0602 Shortness of breath: Secondary | ICD-10-CM | POA: Diagnosis not present

## 2012-11-29 ENCOUNTER — Telehealth: Payer: Self-pay | Admitting: Family Medicine

## 2012-11-29 NOTE — Telephone Encounter (Signed)
APPT MADE

## 2012-12-02 ENCOUNTER — Encounter: Payer: Self-pay | Admitting: Family Medicine

## 2012-12-02 ENCOUNTER — Ambulatory Visit (INDEPENDENT_AMBULATORY_CARE_PROVIDER_SITE_OTHER): Payer: Medicare Other | Admitting: Family Medicine

## 2012-12-02 ENCOUNTER — Telehealth: Payer: Self-pay | Admitting: Family Medicine

## 2012-12-02 ENCOUNTER — Ambulatory Visit (INDEPENDENT_AMBULATORY_CARE_PROVIDER_SITE_OTHER): Payer: Medicare Other

## 2012-12-02 VITALS — BP 155/91 | HR 75 | Temp 97.9°F | Ht 68.0 in | Wt 171.9 lb

## 2012-12-02 DIAGNOSIS — R0600 Dyspnea, unspecified: Secondary | ICD-10-CM | POA: Insufficient documentation

## 2012-12-02 DIAGNOSIS — Z720 Tobacco use: Secondary | ICD-10-CM | POA: Insufficient documentation

## 2012-12-02 DIAGNOSIS — R0609 Other forms of dyspnea: Secondary | ICD-10-CM

## 2012-12-02 DIAGNOSIS — E785 Hyperlipidemia, unspecified: Secondary | ICD-10-CM

## 2012-12-02 DIAGNOSIS — N401 Enlarged prostate with lower urinary tract symptoms: Secondary | ICD-10-CM | POA: Insufficient documentation

## 2012-12-02 DIAGNOSIS — J438 Other emphysema: Secondary | ICD-10-CM

## 2012-12-02 DIAGNOSIS — R350 Frequency of micturition: Secondary | ICD-10-CM

## 2012-12-02 DIAGNOSIS — J439 Emphysema, unspecified: Secondary | ICD-10-CM

## 2012-12-02 DIAGNOSIS — F17200 Nicotine dependence, unspecified, uncomplicated: Secondary | ICD-10-CM

## 2012-12-02 DIAGNOSIS — N400 Enlarged prostate without lower urinary tract symptoms: Secondary | ICD-10-CM

## 2012-12-02 DIAGNOSIS — R0989 Other specified symptoms and signs involving the circulatory and respiratory systems: Secondary | ICD-10-CM

## 2012-12-02 HISTORY — DX: Emphysema, unspecified: J43.9

## 2012-12-02 LAB — POCT UA - MICROSCOPIC ONLY
Bacteria, U Microscopic: NEGATIVE
Casts, Ur, LPF, POC: NEGATIVE
Crystals, Ur, HPF, POC: NEGATIVE
Yeast, UA: NEGATIVE

## 2012-12-02 LAB — POCT URINALYSIS DIPSTICK
Bilirubin, UA: NEGATIVE
Glucose, UA: NEGATIVE
Ketones, UA: NEGATIVE
Leukocytes, UA: NEGATIVE
Nitrite, UA: NEGATIVE
Protein, UA: 300
Spec Grav, UA: 1.015
Urobilinogen, UA: NEGATIVE
pH, UA: 6.5

## 2012-12-02 MED ORDER — ALBUTEROL SULFATE (2.5 MG/3ML) 0.083% IN NEBU: 2.5000 mg | INHALATION_SOLUTION | Freq: Four times a day (QID) | RESPIRATORY_TRACT | Status: DC | PRN

## 2012-12-02 MED ORDER — TIOTROPIUM BROMIDE MONOHYDRATE 18 MCG IN CAPS: 18.0000 ug | ORAL_CAPSULE | Freq: Every day | RESPIRATORY_TRACT | Status: DC

## 2012-12-02 MED ORDER — IPRATROPIUM BROMIDE HFA 17 MCG/ACT IN AERS: 2.0000 | INHALATION_SPRAY | Freq: Four times a day (QID) | RESPIRATORY_TRACT | Status: DC

## 2012-12-02 MED ORDER — BUDESONIDE-FORMOTEROL FUMARATE 160-4.5 MCG/ACT IN AERO: 2.0000 | INHALATION_SPRAY | Freq: Two times a day (BID) | RESPIRATORY_TRACT | Status: AC

## 2012-12-02 MED ORDER — TAMSULOSIN HCL 0.4 MG PO CAPS: 0.4000 mg | ORAL_CAPSULE | Freq: Every day | ORAL | Status: DC

## 2012-12-02 NOTE — Telephone Encounter (Signed)
Spoke with family member and new rx sent to [facility]

## 2012-12-02 NOTE — Patient Instructions (Addendum)
Smoking Cessation Quitting smoking is important to your health and has many advantages. However, it is not always easy to quit since nicotine is a very addictive drug. Often times, people try 3 times or more before being able to quit. This document explains the best ways for you to prepare to quit smoking. Quitting takes hard work and a lot of effort, but you can do it. ADVANTAGES OF QUITTING SMOKING  You will live longer, feel better, and live better.  Your body will feel the impact of quitting smoking almost immediately.  Within 20 minutes, blood pressure decreases. Your pulse returns to its normal level.  After 8 hours, carbon monoxide levels in the blood return to normal. Your oxygen level increases.  After 24 hours, the chance of having a heart attack starts to decrease. Your breath, hair, and body stop smelling like smoke.  After 48 hours, damaged nerve endings begin to recover. Your sense of taste and smell improve.  After 72 hours, the body is virtually free of nicotine. Your bronchial tubes relax and breathing becomes easier.  After 2 to 12 weeks, lungs can hold more air. Exercise becomes easier and circulation improves.  The risk of having a heart attack, stroke, cancer, or lung disease is greatly reduced.  After 1 year, the risk of coronary heart disease is cut in half.  After 5 years, the risk of stroke falls to the same as a nonsmoker.  After 10 years, the risk of lung cancer is cut in half and the risk of other cancers decreases significantly.  After 15 years, the risk of coronary heart disease drops, usually to the level of a nonsmoker.  If you are pregnant, quitting smoking will improve your chances of having a healthy baby.  The people you live with, especially any children, will be healthier.  You will have extra money to spend on things other than cigarettes. QUESTIONS TO THINK ABOUT BEFORE ATTEMPTING TO QUIT You may want to talk about your answers with your  caregiver.  Why do you want to quit?  If you tried to quit in the past, what helped and what did not?  What will be the most difficult situations for you after you quit? How will you plan to handle them?  Who can help you through the tough times? Your family? Friends? A caregiver?  What pleasures do you get from smoking? What ways can you still get pleasure if you quit? Here are some questions to ask your caregiver:  How can you help me to be successful at quitting?  What medicine do you think would be best for me and how should I take it?  What should I do if I need more help?  What is smoking withdrawal like? How can I get information on withdrawal? GET READY  Set a quit date.  Change your environment by getting rid of all cigarettes, ashtrays, matches, and lighters in your home, car, or work. Do not let people smoke in your home.  Review your past attempts to quit. Think about what worked and what did not. GET SUPPORT AND ENCOURAGEMENT You have a better chance of being successful if you have help. You can get support in many ways.  Tell your family, friends, and co-workers that you are going to quit and need their support. Ask them not to smoke around you.  Get individual, group, or telephone counseling and support. Programs are available at local hospitals and health centers. Call your local health department for   information about programs in your area.  Spiritual beliefs and practices may help some smokers quit.  Download a "quit meter" on your computer to keep track of quit statistics, such as how long you have gone without smoking, cigarettes not smoked, and money saved.  Get a self-help book about quitting smoking and staying off of tobacco. LEARN NEW SKILLS AND BEHAVIORS  Distract yourself from urges to smoke. Talk to someone, go for a walk, or occupy your time with a task.  Change your normal routine. Take a different route to work. Drink tea instead of coffee.  Eat breakfast in a different place.  Reduce your stress. Take a hot bath, exercise, or read a book.  Plan something enjoyable to do every day. Reward yourself for not smoking.  Explore interactive web-based programs that specialize in helping you quit. GET MEDICINE AND USE IT CORRECTLY Medicines can help you stop smoking and decrease the urge to smoke. Combining medicine with the above behavioral methods and support can greatly increase your chances of successfully quitting smoking.  Nicotine replacement therapy helps deliver nicotine to your body without the negative effects and risks of smoking. Nicotine replacement therapy includes nicotine gum, lozenges, inhalers, nasal sprays, and skin patches. Some may be available over-the-counter and others require a prescription.  Antidepressant medicine helps people abstain from smoking, but how this works is unknown. This medicine is available by prescription.  Nicotinic receptor partial agonist medicine simulates the effect of nicotine in your brain. This medicine is available by prescription. Ask your caregiver for advice about which medicines to use and how to use them based on your health history. Your caregiver will tell you what side effects to look out for if you choose to be on a medicine or therapy. Carefully read the information on the package. Do not use any other product containing nicotine while using a nicotine replacement product.  RELAPSE OR DIFFICULT SITUATIONS Most relapses occur within the first 3 months after quitting. Do not be discouraged if you start smoking again. Remember, most people try several times before finally quitting. You may have symptoms of withdrawal because your body is used to nicotine. You may crave cigarettes, be irritable, feel very hungry, cough often, get headaches, or have difficulty concentrating. The withdrawal symptoms are only temporary. They are strongest when you first quit, but they will go away within  10 14 days. To reduce the chances of relapse, try to:  Avoid drinking alcohol. Drinking lowers your chances of successfully quitting.  Reduce the amount of caffeine you consume. Once you quit smoking, the amount of caffeine in your body increases and can give you symptoms, such as a rapid heartbeat, sweating, and anxiety.  Avoid smokers because they can make you want to smoke.  Do not let weight gain distract you. Many smokers will gain weight when they quit, usually less than 10 pounds. Eat a healthy diet and stay active. You can always lose the weight gained after you quit.  Find ways to improve your mood other than smoking. FOR MORE INFORMATION  www.smokefree.gov  Document Released: 03/07/2001 Document Revised: 09/12/2011 Document Reviewed: 06/22/2011 [employer] Patient Information 2014 [employer].  

## 2012-12-02 NOTE — Progress Notes (Signed)
Patient ID: Brent Good, male   DOB: [birth year], 67 y.o.   MRN: [id number] SUBJECTIVE: CC:  HPI: Was mowing the yard and he got SOB. Has had trouble with bronchitis. 55 pack-years  Smokers. During the mowing, riding lawn mower used, it was real hot and he couldn't take a deep enough breath. No chest pain. Has an inhaler at home and it eased his symptoms. Red inhaler. He really came to have a refill of his inhaler.  Has BPH symptoms. Frequency of urination with small weak stream. No pain, no hematuria.   Past Medical History  Diagnosis Date  . Pneumonia, pneumococcal   . Bronchitis   . GERD (gastroesophageal reflux disease)    Past Surgical History  Procedure Laterality Date  . Inguinal hernia repair     History   Social History  . Marital Status: Divorced    Spouse Name: N/A    Number of Children: N/A  . Years of Education: N/A   Occupational History  . Not on file.   Social History Main Topics  . Smoking status: Current Every Day Smoker -- 1.00 packs/day    Types: Cigarettes  . Smokeless tobacco: Not on file  . Alcohol Use: No  . Drug Use: No  . Sexual Activity: Yes   Other Topics Concern  . Not on file   Social History Narrative  . No narrative on file   No family history on file. Current Outpatient Prescriptions on File Prior to Visit  Medication Sig Dispense Refill  . naproxen sodium (ANAPROX) 220 MG tablet Take 220 mg by mouth 2 (two) times daily with a meal.        . ranitidine (ZANTAC) 150 MG tablet Take 150 mg by mouth 2 (two) times daily.         No current facility-administered medications on file prior to visit.   No Known Allergies  There is no immunization history on file for this patient. Prior to Admission medications   Medication Sig Start Date End Date Taking? Authorizing Provider  naproxen sodium (ANAPROX) 220 MG tablet Take 220 mg by mouth 2 (two) times daily with a meal.     Yes Historical Provider, MD  ranitidine (ZANTAC) 150 MG  tablet Take 150 mg by mouth 2 (two) times daily.     Yes Historical Provider, MD      ROS: As above in the HPI. All other systems are stable or negative.  OBJECTIVE: APPEARANCE:  Patient in no acute distress.The patient appeared well nourished and normally developed. Acyanotic. Waist: VITAL SIGNS:BP 155/91  Pulse 75  Temp(Src) 97.9 F (36.6 C) (Oral)  Ht 5\' 8"  (1.727 m)  Wt 171 lb 14.4 oz (77.973 kg)  BMI 26.14 kg/m2  SpO2 97% WM  SKIN: warm and  Dry without overt rashes, tattoos and scars  HEAD and Neck: without JVD, Head and scalp: normal Eyes:No scleral icterus. Fundi normal, eye movements normal. Ears: Auricle normal, canal normal, Tympanic membranes normal, insufflation normal. Nose: normal Throat: normal Neck & thyroid: normal  CHEST & LUNGS: Chest wall: normal Lungs: Coarse BS with prolonged expiratory phase and no wheezes  CVS: Reveals the PMI to be normally located. Regular rhythm, First and Second Heart sounds are normal,  absence of murmurs, rubs or gallops. Peripheral vasculature: Radial pulses: normal Dorsal pedis pulses: normal Posterior pulses: normal  ABDOMEN:  Appearance: normal Benign, no organomegaly, no masses, no Abdominal Aortic enlargement. No Guarding , no rebound. No Bruits. Bowel sounds: normal  RECTAL:  moderate enlargement of the prostate. No nodules. Heme negative. GU: N/A  EXTREMETIES: nonedematous.  MUSCULOSKELETAL:  Spine: normal Joints: intact  NEUROLOGIC: oriented to time,place and person; nonfocal. Strength is normal Sensory is normal Reflexes are normal Cranial Nerves are normal.  ASSESSMENT: Frequency of urination - Plan: POCT UA - Microscopic Only, POCT urinalysis dipstick, Urine culture, CMP14+EGFR, PSA, total and free  Dyspnea and respiratory abnormality - Plan: DG Chest 2 View, POCT CBC, EKG 12-Lead, EKG 12-Lead, EKG 12-Lead, EKG 12-Lead, Spirometry with graph, Ambulatory referral to Cardiology, CANCELED:  EKG 12-Lead  COPD (chronic obstructive pulmonary disease) with emphysema - Plan: POCT CBC, budesonide-formoterol (SYMBICORT) 160-4.5 MCG/ACT inhaler, albuterol (PROVENTIL) (2.5 MG/3ML) 0.083% nebulizer solution, ipratropium (ATROVENT HFA) 17 MCG/ACT inhaler, DISCONTINUED: tiotropium (SPIRIVA HANDIHALER) 18 MCG inhalation capsule  Tobacco user - Plan: budesonide-formoterol (SYMBICORT) 160-4.5 MCG/ACT inhaler  Dyslipidemia (high LDL; low HDL) - Plan: NMR, lipoprofile  BPH (benign prostatic hyperplasia) - Plan: PSA, total and free, tamsulosin (FLOMAX) 0.4 MG CAPS capsule   PLAN: Orders Placed This Encounter  Procedures  . Urine culture  . DG Chest 2 View    Standing Status: Future     Number of Occurrences: 1     Standing Expiration Date: 02/01/2014    Order Specific Question:  Reason for Exam (SYMPTOM  OR DIAGNOSIS REQUIRED)    Answer:  dyspnea, tobacco user    Order Specific Question:  Preferred imaging location?    Answer:  Internal  . CMP14+EGFR  . PSA, total and free  . NMR, lipoprofile  . Ambulatory referral to Cardiology    Referral Priority:  Routine    Referral Type:  Consultation    Referral Reason:  Specialty Services Required    Requested Specialty:  Cardiology    Number of Visits Requested:  1  . Spirometry with graph    Standing Status: Future     Number of Occurrences:      Standing Expiration Date: 12/02/2013  . POCT UA - Microscopic Only  . POCT urinalysis dipstick  . POCT CBC  . EKG 12-Lead    Standing Status: Standing     Number of Occurrences: 1     Standing Expiration Date:     Order Specific Question:  Reason for Exam    Answer:  shortness of breath  . EKG 12-Lead    Standing Status: Standing     Number of Occurrences: 1     Standing Expiration Date:     Order Specific Question:  Reason for Exam    Answer:  shortness of breath    EKG: no acute changes.  [facility] reading (PRIMARY) by  Dr Modesto Charon: hyperinflation: COPD.  Results for orders placed in  visit on 12/02/12  POCT UA - MICROSCOPIC ONLY      Result Value Range   WBC, Ur, HPF, POC 1-5     RBC, urine, microscopic 1-5     Bacteria, U Microscopic neg     Mucus, UA many     Epithelial cells, urine per micros occ     Crystals, Ur, HPF, POC neg     Casts, Ur, LPF, POC neg     Yeast, UA neg    POCT URINALYSIS DIPSTICK      Result Value Range   Color, UA yellow     Clarity, UA clear     Glucose, UA neg     Bilirubin, UA neg     Ketones, UA neg     Spec Grav, UA  1.015     Blood, UA trace     pH, UA 6.5     Protein, UA 300     Urobilinogen, UA negative     Nitrite, UA neg     Leukocytes, UA Negative     Meds ordered this encounter  Medications  . budesonide-formoterol (SYMBICORT) 160-4.5 MCG/ACT inhaler    Sig: Inhale 2 puffs into the lungs 2 (two) times daily.    Dispense:  1 Inhaler    Refill:  3  . DISCONTD: tiotropium (SPIRIVA HANDIHALER) 18 MCG inhalation capsule    Sig: Place 1 capsule (18 mcg total) into inhaler and inhale daily.    Dispense:  30 capsule    Refill:  12  . albuterol (PROVENTIL) (2.5 MG/3ML) 0.083% nebulizer solution    Sig: Take 3 mLs (2.5 mg total) by nebulization every 6 (six) hours as needed for wheezing or shortness of breath (as a rescue drug).    Dispense:  150 mL    Refill:  1  . tamsulosin (FLOMAX) 0.4 MG CAPS capsule    Sig: Take 1 capsule (0.4 mg total) by mouth daily.    Dispense:  30 capsule    Refill:  3  . ipratropium (ATROVENT HFA) 17 MCG/ACT inhaler    Sig: Inhale 2 puffs into the lungs every 6 (six) hours.    Dispense:  1 Inhaler    Refill:  5   Medications Discontinued During This Encounter  Medication Reason  . chlorpheniramine-HYDROcodone (TUSSIONEX PENNKINETIC ER) 10-8 MG/5ML [facility] Completed Course  . tiotropium (SPIRIVA HANDIHALER) 18 MCG inhalation capsule Cost of medication    PFT's reviewed and scanned to chart. COPD.  Discussed smoking cessation and the findings on Chest XRay EKG and the PFTs.  Return in  about 4 weeks (around 12/30/2012) for Recheck medical problems.  Argus Caraher P. Modesto Charon, M.D.

## 2012-12-02 NOTE — Telephone Encounter (Signed)
Family member aware rx

## 2012-12-03 LAB — URINE CULTURE

## 2012-12-06 ENCOUNTER — Telehealth: Payer: Self-pay | Admitting: Family Medicine

## 2012-12-06 NOTE — Telephone Encounter (Signed)
Called Brent Good at her home number --no answer  Pt notified and questions answered

## 2012-12-11 ENCOUNTER — Telehealth: Payer: Self-pay | Admitting: Family Medicine

## 2012-12-12 NOTE — Telephone Encounter (Signed)
Patient needs to be seen. Has exceeded time since last visit. Needs to bring all medications to next appointment.   

## 2012-12-13 NOTE — Telephone Encounter (Signed)
Pt notifed that he needs to be seen to check the blisters but pt refused appt

## 2013-01-02 ENCOUNTER — Ambulatory Visit: Payer: Medicare Other | Admitting: Family Medicine

## 2013-01-07 ENCOUNTER — Other Ambulatory Visit: Payer: Self-pay | Admitting: Family Medicine

## 2013-01-07 DIAGNOSIS — M810 Age-related osteoporosis without current pathological fracture: Secondary | ICD-10-CM

## 2013-02-27 ENCOUNTER — Encounter: Payer: Self-pay | Admitting: Family Medicine

## 2013-02-27 ENCOUNTER — Ambulatory Visit (INDEPENDENT_AMBULATORY_CARE_PROVIDER_SITE_OTHER): Payer: Medicare Other | Admitting: Family Medicine

## 2013-02-27 VITALS — BP 159/99 | HR 82 | Temp 97.3°F | Ht 68.0 in | Wt 175.8 lb

## 2013-02-27 DIAGNOSIS — M542 Cervicalgia: Secondary | ICD-10-CM | POA: Diagnosis not present

## 2013-02-27 DIAGNOSIS — I10 Essential (primary) hypertension: Secondary | ICD-10-CM | POA: Diagnosis not present

## 2013-02-27 MED ORDER — CYCLOBENZAPRINE HCL 10 MG PO TABS: 10.0000 mg | ORAL_TABLET | Freq: Three times a day (TID) | ORAL | Status: DC | PRN

## 2013-02-27 MED ORDER — AMLODIPINE BESYLATE 5 MG PO TABS: 5.0000 mg | ORAL_TABLET | Freq: Every day | ORAL | Status: DC

## 2013-02-27 NOTE — Patient Instructions (Signed)

## 2013-02-27 NOTE — Progress Notes (Signed)
   Subjective:    Patient ID: Kylee Nardozzi, male    DOB: [birth year], 67 y.o.   MRN: [id number]  HPI This 67 y.o. male presents for evaluation of neck discomfort causing headaches. He has had this for 3 days.  He has been taking advil for the discomfort and it is not  helping.   Review of Systems No chest pain, SOB, HA, dizziness, vision change, N/V, diarrhea, constipation, dysuria, urinary urgency or frequency, myalgias, arthralgias or rash.     Objective:   Physical Exam Vital signs noted  Well developed well nourished male.  HEENT - Head atraumatic Normocephalic                Eyes - PERRLA, Conjuctiva - clear Sclera- Clear EOMI                Ears - EAC's Wnl TM's Wnl Gross Hearing WNL                Nose - Nares patent                 Throat - oropharanx wnl Respiratory - Lungs CTA bilateral Cardiac - RRR S1 and S2 without murmur GI - Abdomen soft Nontender and bowel sounds active x 4 Extremities - No edema. Neuro - Grossly intact. vb       Assessment & Plan:

## 2013-03-31 ENCOUNTER — Ambulatory Visit: Payer: Medicare Other | Admitting: Family Medicine

## 2013-06-09 ENCOUNTER — Telehealth: Payer: Self-pay | Admitting: Family Medicine

## 2013-06-09 NOTE — Telephone Encounter (Signed)
appt tomorrow with bill

## 2013-06-10 ENCOUNTER — Encounter: Payer: Self-pay | Admitting: Family Medicine

## 2013-06-10 ENCOUNTER — Ambulatory Visit (INDEPENDENT_AMBULATORY_CARE_PROVIDER_SITE_OTHER): Payer: Medicare Other | Admitting: Family Medicine

## 2013-06-10 VITALS — BP 140/87 | HR 81 | Temp 96.9°F | Ht 68.0 in | Wt 177.4 lb

## 2013-06-10 DIAGNOSIS — J441 Chronic obstructive pulmonary disease with (acute) exacerbation: Secondary | ICD-10-CM

## 2013-06-10 MED ORDER — PREDNISONE (PAK) 10 MG PO TABS: ORAL_TABLET | Freq: Every day | ORAL | Status: DC

## 2013-06-10 MED ORDER — AMOXICILLIN 875 MG PO TABS: 875.0000 mg | ORAL_TABLET | Freq: Two times a day (BID) | ORAL | Status: DC

## 2013-06-10 NOTE — Progress Notes (Signed)
   Subjective:    Patient ID: Brent Good, male    DOB: 11/18/1945, 68 y.o.   MRN: [id number]  HPI This 68 y.o. male presents for evaluation of shortness of breath and COPD exacerbation.   Review of Systems C/o cough No chest pain, SOB, HA, dizziness, vision change, N/V, diarrhea, constipation, dysuria, urinary urgency or frequency, myalgias, arthralgias or rash.     Objective:   Physical Exam  Vital signs noted  Well developed well nourished male  HEENT - Head atraumatic Normocephalic                Eyes - PERRLA, Conjuctiva - clear Sclera- Clear EOMI                Ears - EAC's Wnl TM's Wnl Gross Hearing WNL                Throat - oropharanx wnl Respiratory - Lungs CTA bilateral Cardiac - RRR S1 and S2 without murmur GI - Abdomen soft Nontender and bowel sounds active x 4 Extremities - No edema. Neuro - Grossly intact.      Assessment & Plan:  Copd exacerbation - Amoxicillin 875mg  one po bid x 10 days, Prednisone 10mg  4 po qd x 2 days then 3 po qd x 2 days then 2 po qd x 2 days then 1 po qd x 2 days then stop.  Push po fluids, rest, tylenol and motrin otc prn as directed for fever, arthralgias, and myalgias.  Follow up prn if sx's continue or persist. #2 samples of symbicort given 160/4.5 Neb tx given Patient advised to quit smoking  Deatra CanterWilliam J Jobina Maita FNP

## 2013-11-20 ENCOUNTER — Encounter: Payer: Self-pay | Admitting: Family Medicine

## 2013-11-20 ENCOUNTER — Ambulatory Visit (INDEPENDENT_AMBULATORY_CARE_PROVIDER_SITE_OTHER): Payer: Medicare Other | Admitting: Family Medicine

## 2013-11-20 VITALS — BP 151/94 | HR 89 | Temp 99.0°F | Ht 68.0 in | Wt 181.2 lb

## 2013-11-20 DIAGNOSIS — N138 Other obstructive and reflux uropathy: Secondary | ICD-10-CM

## 2013-11-20 DIAGNOSIS — R319 Hematuria, unspecified: Secondary | ICD-10-CM

## 2013-11-20 DIAGNOSIS — R350 Frequency of micturition: Secondary | ICD-10-CM

## 2013-11-20 DIAGNOSIS — R3911 Hesitancy of micturition: Secondary | ICD-10-CM

## 2013-11-20 DIAGNOSIS — N400 Enlarged prostate without lower urinary tract symptoms: Secondary | ICD-10-CM | POA: Diagnosis not present

## 2013-11-20 DIAGNOSIS — N401 Enlarged prostate with lower urinary tract symptoms: Secondary | ICD-10-CM | POA: Diagnosis not present

## 2013-11-20 LAB — POCT URINALYSIS DIPSTICK
Bilirubin, UA: NEGATIVE
GLUCOSE UA: NEGATIVE
KETONES UA: NEGATIVE
Leukocytes, UA: NEGATIVE
Nitrite, UA: NEGATIVE
Urobilinogen, UA: NEGATIVE
pH, UA: 6.5

## 2013-11-20 LAB — POCT UA - MICROSCOPIC ONLY
Bacteria, U Microscopic: NEGATIVE
CASTS, UR, LPF, POC: NEGATIVE
Crystals, Ur, HPF, POC: NEGATIVE
WBC, Ur, HPF, POC: NEGATIVE
YEAST UA: NEGATIVE

## 2013-11-20 MED ORDER — FINASTERIDE 5 MG PO TABS: 5.0000 mg | ORAL_TABLET | Freq: Every day | ORAL | Status: DC

## 2013-11-20 MED ORDER — TAMSULOSIN HCL 0.4 MG PO CAPS: 0.8000 mg | ORAL_CAPSULE | Freq: Every day | ORAL | Status: AC

## 2013-11-20 MED ORDER — TAMSULOSIN HCL 0.4 MG PO CAPS: 0.8000 mg | ORAL_CAPSULE | Freq: Every day | ORAL | Status: DC

## 2013-11-20 NOTE — Progress Notes (Signed)
Brent Good is a 68 y.o. male who presents to [facility] today for dysuria  Dysuria: 2.30mo ago started developing double frequency. 3-4x nightly and 2-3 x daily. Also w/ hesitency. Denies dysuria or abd pain. Denies CP, SOb, night sweats, bone pain, unintentional wt loss. Increased water intake makes it worse. Recurring episodes every couple years. Stopped tamsulosin 3 days ago as it was not helping.    The following portions of the patient's history were reviewed and updated as appropriate: allergies, current medications, past medical history, family and social history, and problem list.  Patient is a smoker - 1ppd  Past Medical History  Diagnosis Date  . Pneumonia, pneumococcal   . Bronchitis   . GERD (gastroesophageal reflux disease)   . COPD (chronic obstructive pulmonary disease) with emphysema 12/02/2012    ROS as above otherwise neg.    Medications reviewed. Current Outpatient Prescriptions  Medication Sig Dispense Refill  . amLODipine (NORVASC) 5 MG tablet Take 1 tablet (5 mg total) by mouth daily.  90 tablet  3  . budesonide-formoterol (SYMBICORT) 160-4.5 MCG/ACT inhaler Inhale 2 puffs into the lungs 2 (two) times daily.  1 Inhaler  3  . cyclobenzaprine (FLEXERIL) 10 MG tablet Take 1 tablet (10 mg total) by mouth 3 (three) times daily as needed for muscle spasms.  30 tablet  1  . ipratropium (ATROVENT HFA) 17 MCG/ACT inhaler Inhale 2 puffs into the lungs every 6 (six) hours.  1 Inhaler  5  . naproxen sodium (ANAPROX) 220 MG tablet Take 220 mg by mouth 2 (two) times daily with a meal.        . ranitidine (ZANTAC) 150 MG tablet Take 150 mg by mouth 2 (two) times daily.        . tamsulosin (FLOMAX) 0.4 MG CAPS capsule Take 2 capsules (0.8 mg total) by mouth daily.  180 capsule  3  . Tiotropium Bromide Monohydrate (SPIRIVA HANDIHALER IN) Inhale into the lungs.      . finasteride (PROSCAR) 5 MG tablet Take 1 tablet (5 mg total) by mouth daily.  90 tablet  3   No current  facility-administered medications for this visit.    Exam: BP 151/94  Pulse 89  Temp(Src) 99 F (37.2 C) (Oral)  Ht  (1.727 m)  Wt 181 lb 3.2 oz (82.192 kg)  BMI 27.56 kg/m2 Gen: Well NAD HEENT: EOMI,  MMM Lungs: CTABL Nl WOB Heart: RRR no MRG Abd: NABS, NT, ND Exts: Non edematous BL  LE, warm and well perfused.   Results for orders placed in visit on 11/20/13 (from the past 72 hour(s))  POCT URINALYSIS DIPSTICK     Status: None   Collection Time    11/20/13 10:31 AM      Result Value Ref Range   Color, UA YELLOW     Clarity, UA CLOUDY     Glucose, UA NEG     Bilirubin, UA NEG     Ketones, UA NEG     Spec Grav, UA >=1.030     Blood, UA LARGE     pH, UA 6.5     Protein, UA 4++++     Urobilinogen, UA negative     Nitrite, UA NEG     Leukocytes, UA Negative    POCT UA - MICROSCOPIC ONLY     Status: None   Collection Time    11/20/13 10:32 AM      Result Value Ref Range   WBC, Ur, HPF, POC NEG  RBC, urine, microscopic 5-10     Bacteria, U Microscopic NEG     Mucus, UA MOD     Epithelial cells, urine per micros OCC     Crystals, Ur, HPF, POC NEG     Casts, Ur, LPF, POC NEG     Yeast, UA NEG      A/P (as seen in Problem list)  Urinary hesitancy due to benign prostatic hypertrophy Increase flomax to 0.8 Start finasteride - pt aware of ED side effect May need urology   Hematuria 5-10 on microscopy today Hematuria noted previously Smoker and BPH. Repeat UA microscopy in 2 wks, if greater than 2-3 RBC per high power field then refer to Urology for furether evaluation

## 2013-11-20 NOTE — Addendum Note (Signed)
Addended by: Konrad Dolores, DAVID J on: 11/20/2013 11:02 AM   Modules accepted: Orders

## 2013-11-20 NOTE — Assessment & Plan Note (Signed)
Increase flomax to 0.8 Start finasteride - pt aware of ED side effect May need urology

## 2013-11-20 NOTE — Assessment & Plan Note (Signed)
5-10 on microscopy today Hematuria noted previously Smoker and BPH. Repeat UA microscopy in 2 wks, if greater than 2-3 RBC per high power field then refer to Urology for furether evaluation

## 2013-11-20 NOTE — Patient Instructions (Signed)
Your symptoms are all likely from an enlarged prostate pushing on your urethra Please start the increased dose of flomax Please consider starting the proscar to slow down the increase in size of the prostate Please come back in 2 weeks for another urine test as you may need to go to the urologist.

## 2013-12-05 ENCOUNTER — Ambulatory Visit (INDEPENDENT_AMBULATORY_CARE_PROVIDER_SITE_OTHER): Payer: Medicare Other | Admitting: Family

## 2013-12-05 ENCOUNTER — Encounter: Payer: Self-pay | Admitting: Family

## 2013-12-05 VITALS — BP 173/95 | HR 86 | Temp 97.2°F | Ht 68.0 in | Wt 181.0 lb

## 2013-12-05 DIAGNOSIS — M7710 Lateral epicondylitis, unspecified elbow: Secondary | ICD-10-CM | POA: Diagnosis not present

## 2013-12-05 DIAGNOSIS — M7712 Lateral epicondylitis, left elbow: Secondary | ICD-10-CM

## 2013-12-05 MED ORDER — KETOROLAC TROMETHAMINE 30 MG/ML IJ SOLN
30.0000 mg | Freq: Once | INTRAMUSCULAR | Status: AC
  Administered 2013-12-05: 30 mg via INTRAMUSCULAR

## 2013-12-05 MED ORDER — METHYLPREDNISOLONE (PAK) 4 MG PO TABS: ORAL_TABLET | ORAL | Status: DC

## 2013-12-05 MED ORDER — MELOXICAM 15 MG PO TABS: 15.0000 mg | ORAL_TABLET | Freq: Every day | ORAL | Status: DC

## 2013-12-05 NOTE — Patient Instructions (Signed)
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 Although the condition is often called tennis or golfer's elbow, it is caused by any repetitive action performed by your elbow. HOME CARE INSTRUCTIONS  If the condition has been short lived, rest may be the only treatment required. Using your opposite hand or arm to perform the task may help. Even changing your grip may help rest the extremity. These may even prevent the condition from recurring.  Longer standing problems, however, will often be relieved faster by:  Using anti-inflammatory agents.  Applying ice packs for 30 minutes at the end of the working day, at bed time, or when activities are finished.  Your caregiver may also have you wear a splint or sling. This will allow the inflamed tendon to heal. At times, steroid injections aided with a local anesthetic will be required along with splinting for 1 to 2 weeks. Two to three steroid injections will often solve the problem. In some long standing cases, the inflamed tendon does not respond to conservative (non-surgical) therapy. Then surgery may be required to repair it. MAKE SURE YOU:   Understand these instructions.  Will watch your condition.  Will get help right away if you are not doing well or get worse. Document Released: 03/13/2005 Document Revised: 06/05/2011 Document Reviewed: 10/30/2007 Exit[employer] Patient Information 2015 [employer]. This information is not intended to replace advice given to you by your health care provider. Make sure you discuss any questions you have with your health care provider.  

## 2013-12-05 NOTE — Progress Notes (Signed)
   Subjective:    Patient ID: Brent Good, male    DOB: 07-Aug-1945, 68 y.o.   MRN: [id number]  Shoulder Pain  The pain is present in the left shoulder and left elbow. This is a new problem. The current episode started in the past 7 days. There has been no history of extremity trauma. The problem occurs constantly. The problem has been gradually improving. The quality of the pain is described as sharp. The pain is at a severity of 10/10. The pain is moderate. Associated symptoms include joint swelling and tingling ("in pinky and ring finger"). Pertinent negatives include no fever, inability to bear weight, limited range of motion or numbness. He has tried NSAIDS and rest for the symptoms. The treatment provided mild relief. Family history does not include gout. His past medical history is significant for osteoarthritis. There is no history of diabetes or gout.      Review of Systems  Constitutional: Negative.  Negative for fever.  HENT: Negative.   Respiratory: Negative.   Cardiovascular: Negative.   Gastrointestinal: Negative.   Endocrine: Negative.   Genitourinary: Negative.   Musculoskeletal: Negative.  Negative for gout.  Neurological: Positive for tingling ("in pinky and ring finger"). Negative for numbness.  Hematological: Negative.   Psychiatric/Behavioral: Negative.   All other systems reviewed and are negative.      Objective:   Physical Exam  Vitals reviewed. Constitutional: He is oriented to person, place, and time. He appears well-developed and well-nourished. No distress.  Cardiovascular: Normal rate, regular rhythm, normal heart sounds and intact distal pulses.   No murmur heard. Pulmonary/Chest: Effort normal and breath sounds normal. No respiratory distress. He has no wheezes.  Abdominal: Soft. Bowel sounds are normal. He exhibits no distension. There is no tenderness.  Musculoskeletal: Normal range of motion. He exhibits edema (Mild swelling on lateral side of left  elbow).  Full ROM of left shoulder and elbow-  Neurological: He is alert and oriented to person, place, and time. He has normal reflexes. No cranial nerve deficit.  Skin: Skin is warm and dry. No rash noted. No erythema.  Psychiatric: He has a normal mood and affect. His behavior is normal. Judgment and thought content normal.      BP 173/95  Pulse 86  Temp(Src) 97.2 F (36.2 C) (Oral)  Ht $R'5\' 8"'Oh$  (1.727 m)  Wt 181 lb (82.101 kg)  BMI 27.53 kg/m2     Assessment & Plan:  1. Tennis elbow, left -REST -ice -No other NSAID's -Pt needs to f/u with blood pressure when pain is under control - methylPREDNIsolone (MEDROL DOSPACK) 4 MG tablet; follow package directions  Dispense: 21 tablet; Refill: 0 - meloxicam (MOBIC) 15 MG tablet; Take 1 tablet (15 mg total) by mouth daily.  Dispense: 30 tablet; Refill: 0 - CMP14+EGFR  Evelina Dun, FNP  - ketorolac (TORADOL) 30 MG/ML injection 30 mg; Inject 1 mL (30 mg total) into the muscle once.

## 2015-03-05 ENCOUNTER — Telehealth: Payer: Self-pay | Admitting: Nurse Practitioner

## 2015-06-08 ENCOUNTER — Ambulatory Visit (INDEPENDENT_AMBULATORY_CARE_PROVIDER_SITE_OTHER): Payer: Medicare Other | Admitting: Nurse Practitioner

## 2015-06-08 ENCOUNTER — Ambulatory Visit (INDEPENDENT_AMBULATORY_CARE_PROVIDER_SITE_OTHER): Payer: Medicare Other

## 2015-06-08 ENCOUNTER — Encounter: Payer: Self-pay | Admitting: Nurse Practitioner

## 2015-06-08 VITALS — BP 138/80 | HR 88 | Temp 97.0°F | Ht 68.0 in | Wt 182.8 lb

## 2015-06-08 DIAGNOSIS — M25511 Pain in right shoulder: Secondary | ICD-10-CM

## 2015-06-08 DIAGNOSIS — M25519 Pain in unspecified shoulder: Secondary | ICD-10-CM | POA: Insufficient documentation

## 2015-06-08 NOTE — Patient Instructions (Signed)

## 2015-06-08 NOTE — Progress Notes (Signed)
   Subjective:    Patient ID: Brent Good, male    DOB: 11/08/1945, 70 y.o.   MRN: [id number]  Shoulder Pain  The pain is present in the right shoulder and right arm. This is a recurrent problem. The current episode started more than 1 month ago. There has been no history of extremity trauma. The problem occurs constantly. The problem has been rapidly worsening. The quality of the pain is described as aching and dull. The pain is at a severity of 5/10. The pain is moderate. Associated symptoms include numbness, stiffness and tingling. Pertinent negatives include no inability to bear weight, joint locking, joint swelling or limited range of motion. The symptoms are aggravated by activity and standing. He has tried NSAIDS, heat and cold (cold pack made the pain worse) for the symptoms. The treatment provided no relief. His past medical history is significant for osteoarthritis.      Review of Systems  Constitutional: Positive for fatigue. Negative for appetite change and unexpected weight change.  Respiratory: Negative.   Cardiovascular: Negative for chest pain, palpitations and leg swelling.  Musculoskeletal: Positive for stiffness.  Neurological: Positive for tingling and numbness. Negative for dizziness.       Tingling and numbeness on bilateral finger tips       Objective:   Physical Exam  Constitutional: He is oriented to person, place, and time. He appears well-developed and well-nourished. No distress.  Neck: Normal range of motion. Neck supple.  Swollen nodule on the mandibular joint bellow right ear lobe , its soft and rubbery  Cardiovascular: Normal rate, regular rhythm, normal heart sounds and intact distal pulses.   Pulmonary/Chest: Effort normal and breath sounds normal. No respiratory distress. He exhibits no tenderness.  Musculoskeletal: Normal range of motion. He exhibits tenderness. He exhibits no edema.  FROM of right shoulder-  Pain on palpation anterior shoulder along  clavicle Motor strength and sensation distally intact Grips equal bil  Neurological: He is alert and oriented to person, place, and time.  Positive shoulder shrug  Skin: Skin is warm.  Psychiatric: He has a normal mood and affect. His behavior is normal. Judgment and thought content normal.    BP 138/80 mmHg  Pulse 88  Temp(Src) 97 F (36.1 C) (Oral)  Ht 5\' 8"  (1.727 m)  Wt 182 lb 12.8 oz (82.918 kg)  BMI 27.80 kg/m2  Right shoulder x ray- no abnormalities- Preliminary reading by Paulene FloorMary Caylor Tallarico, FNP  [facility]     Assessment & Plan:  1. Pain in joint of right shoulder Continue naprosyn as rx Rest RTO prn - DG Shoulder Right; Future - Ambulatory referral to Orthopedic Surgery- patient declined  Mary-Margaret Daphine DeutscherMartin, FNP

## 2015-06-21 ENCOUNTER — Other Ambulatory Visit: Payer: Self-pay | Admitting: Nurse Practitioner

## 2015-06-21 ENCOUNTER — Telehealth: Payer: Self-pay

## 2015-06-21 DIAGNOSIS — M542 Cervicalgia: Secondary | ICD-10-CM

## 2015-06-21 DIAGNOSIS — M25511 Pain in right shoulder: Secondary | ICD-10-CM

## 2015-06-21 NOTE — Telephone Encounter (Signed)
Sister calling saying that you are suppose [name]

## 2015-06-22 ENCOUNTER — Other Ambulatory Visit (HOSPITAL_COMMUNITY): Payer: Medicare Other

## 2015-06-22 NOTE — Telephone Encounter (Signed)
Already taken care of

## 2015-09-15 DIAGNOSIS — Z23 Encounter for immunization: Secondary | ICD-10-CM | POA: Diagnosis not present

## 2015-09-15 DIAGNOSIS — Z7951 Long term (current) use of inhaled steroids: Secondary | ICD-10-CM | POA: Diagnosis not present

## 2015-09-15 DIAGNOSIS — F17218 Nicotine dependence, cigarettes, with other nicotine-induced disorders: Secondary | ICD-10-CM | POA: Diagnosis not present

## 2015-09-15 DIAGNOSIS — Z79899 Other long term (current) drug therapy: Secondary | ICD-10-CM | POA: Diagnosis not present

## 2015-09-15 DIAGNOSIS — Z9189 Other specified personal risk factors, not elsewhere classified: Secondary | ICD-10-CM | POA: Diagnosis not present

## 2015-09-15 DIAGNOSIS — Z716 Tobacco abuse counseling: Secondary | ICD-10-CM | POA: Diagnosis not present

## 2015-09-15 DIAGNOSIS — J449 Chronic obstructive pulmonary disease, unspecified: Secondary | ICD-10-CM | POA: Diagnosis not present

## 2016-06-22 DIAGNOSIS — R221 Localized swelling, mass and lump, neck: Secondary | ICD-10-CM | POA: Diagnosis not present

## 2016-06-26 DIAGNOSIS — Z01818 Encounter for other preprocedural examination: Secondary | ICD-10-CM | POA: Diagnosis not present

## 2016-06-26 DIAGNOSIS — K119 Disease of salivary gland, unspecified: Secondary | ICD-10-CM | POA: Diagnosis not present

## 2017-02-06 ENCOUNTER — Emergency Department (HOSPITAL_COMMUNITY): Payer: Medicare Other

## 2017-02-06 ENCOUNTER — Encounter (HOSPITAL_COMMUNITY): Payer: Self-pay | Admitting: Emergency Medicine

## 2017-02-06 ENCOUNTER — Emergency Department (HOSPITAL_COMMUNITY)
Admission: EM | Admit: 2017-02-06 | Discharge: 2017-02-06 | Disposition: A | Discharge status: Home / Self Care | Payer: Medicare Other | Attending: Emergency Medicine | Admitting: Emergency Medicine

## 2017-02-06 DIAGNOSIS — J449 Chronic obstructive pulmonary disease, unspecified: Secondary | ICD-10-CM | POA: Insufficient documentation

## 2017-02-06 DIAGNOSIS — Z79899 Other long term (current) drug therapy: Secondary | ICD-10-CM | POA: Insufficient documentation

## 2017-02-06 DIAGNOSIS — F17210 Nicotine dependence, cigarettes, uncomplicated: Secondary | ICD-10-CM | POA: Insufficient documentation

## 2017-02-06 DIAGNOSIS — R000 Tachycardia, unspecified: Secondary | ICD-10-CM | POA: Diagnosis not present

## 2017-02-06 DIAGNOSIS — R0602 Shortness of breath: Secondary | ICD-10-CM

## 2017-02-06 LAB — CBC
HEMATOCRIT: 45.9 % (ref 39.0–52.0)
Hemoglobin: 15.6 g/dL (ref 13.0–17.0)
MCH: 31 pg (ref 26.0–34.0)
MCHC: 34 g/dL (ref 30.0–36.0)
MCV: 91.1 fL (ref 78.0–100.0)
PLATELETS: 258 10*3/uL (ref 150–400)
RBC: 5.04 MIL/uL (ref 4.22–5.81)
RDW: 14.4 % (ref 11.5–15.5)
WBC: 8.6 10*3/uL (ref 4.0–10.5)

## 2017-02-06 LAB — BASIC METABOLIC PANEL
ANION GAP: 10 (ref 5–15)
BUN: 24 mg/dL — ABNORMAL HIGH (ref 6–20)
CO2: 23 mmol/L (ref 22–32)
CREATININE: 1.03 mg/dL (ref 0.61–1.24)
Calcium: 8.5 mg/dL — ABNORMAL LOW (ref 8.9–10.3)
Chloride: 104 mmol/L (ref 101–111)
GFR calc non Af Amer: >60 mL/min (ref >60)
GLUCOSE: 100 mg/dL — AB (ref 65–99)
Potassium: 4.2 mmol/L (ref 3.5–5.1)
SODIUM: 137 mmol/L (ref 135–145)

## 2017-02-06 MED ORDER — DOXYCYCLINE HYCLATE 100 MG PO CAPS: 100.0000 mg | ORAL_CAPSULE | Freq: Two times a day (BID) | ORAL | 0 refills | Status: DC

## 2017-02-06 MED ORDER — IPRATROPIUM-ALBUTEROL 0.5-2.5 (3) MG/3ML IN SOLN
3.0000 mL | Freq: Once | RESPIRATORY_TRACT | Status: AC
  Administered 2017-02-06: 3 mL via RESPIRATORY_TRACT
  Filled 2017-02-06: qty 3

## 2017-02-06 MED ORDER — SODIUM CHLORIDE 0.9 % IV BOLUS (SEPSIS)
1000.0000 mL | Freq: Once | INTRAVENOUS | Status: AC
  Administered 2017-02-06: 1000 mL via INTRAVENOUS

## 2017-02-06 MED ORDER — ALBUTEROL (5 MG/ML) CONTINUOUS INHALATION SOLN
INHALATION_SOLUTION | RESPIRATORY_TRACT | Status: AC
  Filled 2017-02-06: qty 1

## 2017-02-06 MED ORDER — METHYLPREDNISOLONE SODIUM SUCC 125 MG IJ SOLR
125.0000 mg | Freq: Once | INTRAMUSCULAR | Status: AC
  Administered 2017-02-06: 125 mg via INTRAVENOUS
  Filled 2017-02-06: qty 2

## 2017-02-06 MED ORDER — PREDNISONE 20 MG PO TABS: 40.0000 mg | ORAL_TABLET | Freq: Every day | ORAL | 0 refills | Status: DC

## 2017-02-06 MED ORDER — ALBUTEROL SULFATE (2.5 MG/3ML) 0.083% IN NEBU
5.0000 mg | INHALATION_SOLUTION | Freq: Once | RESPIRATORY_TRACT | Status: AC
  Administered 2017-02-06: 5 mg via RESPIRATORY_TRACT

## 2017-02-06 NOTE — ED Notes (Signed)
Ambulated pt in hallway. Pt denied dizziness, sob or weakness. Pt stated he felt "normal" and improved from earlier. Steady gait noted. O2 sat stable at 93-94%.

## 2017-02-06 NOTE — Discharge Instructions (Signed)
It was my pleasure taking care of you today!   Please take all of your antibiotics until finished!  Start taking your prednisone tomorrow morning. You had your first dose in the ER today.   Please call your primary care doctor today or tomorrow to schedule a follow up appointment.   Return to ER for new or worsening symptoms, any additional concerns.

## 2017-02-06 NOTE — ED Notes (Signed)
Patient given discharge instructions and verbalized understanding.  Patient stable to discharge at this time.  Patient is alert and oriented to baseline.  No distressed noted at this time.  All belongings taken with the patient at discharge.   

## 2017-02-06 NOTE — ED Provider Notes (Signed)
[facility] Provider Note   CSN: [id number] Arrival date & time: 02/06/17  0901     History   Chief Complaint Chief Complaint  Patient presents with  . Shortness of Breath    HPI Brent Good is a 71 y.o. male.  The history is provided by the patient and medical records. No language interpreter was used.  Shortness of Breath  Associated symptoms include cough (Chronic). Pertinent negatives include no sore throat, no wheezing and no chest pain.   Brent Good is a 71 y.o. male  with a PMH of COPD, GERD who presents to the Emergency Department complaining of progressively worsening shortness of breath x 2 days. Associated with nasal congestion and "a head cold" for the last 2 days as well. He has been using his home albuterol nebulizer every hour hours with little improvement. He states that he walks about 40 feet and then will get winded which is unusual for him. Hx of chronic cough 2/2 COPD. No new changes in cough.  Denies fever, chills, chest pain. No history of PE/DVT. No recent travel / surgeries / immobilizations. No leg swelling.   Past Medical History:  Diagnosis Date  . Bronchitis   . 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 GERD (gastroesophageal reflux disease)   . Pneumonia, pneumococcal [facility](HCC)     Patient Active Problem List   Diagnosis Date Noted  . Pain in joint, shoulder region 06/08/2015  . Hematuria 11/20/2013  . 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 Urinary hesitancy due to benign prostatic hypertrophy 12/02/2012  . Dyspnea and respiratory abnormality 12/02/2012  . Tobacco user 12/02/2012    Past Surgical History:  Procedure Laterality Date  . INGUINAL HERNIA REPAIR         Home Medications    Prior to Admission medications   Medication Sig Start Date End Date Taking? Authorizing Provider  albuterol (PROVENTIL HFA;VENTOLIN  HFA) 108 (90 Base) MCG/ACT inhaler Inhale 2 puffs into the lungs every 6 (six) hours as needed for wheezing or shortness of breath.    [provider]  amLODipine (NORVASC) 5 MG tablet Take 1 tablet (5 mg total) by mouth daily. 02/27/13   Deatra Canterxford, William J, FNP  budesonide-formoterol (SYMBICORT) 160-4.5 MCG/ACT inhaler Inhale 2 puffs into the lungs 2 (two) times daily. 12/02/12   Ileana LaddWong, Francis P, MD  finasteride (PROSCAR) 5 MG tablet Take 1 tablet (5 mg total) by mouth daily. 11/20/13   Ozella RocksMerrell, David J, MD  ipratropium (ATROVENT HFA) 17 MCG/ACT inhaler Inhale 2 puffs into the lungs every 6 (six) hours. 12/02/12   Ileana LaddWong, Francis P, MD  methylPREDNIsolone (MEDROL DOSPACK) 4 MG tablet follow package directions 12/05/13   Jannifer RodneyHawks, Christy A, FNP  naproxen sodium (ANAPROX) 220 MG tablet Take 220 mg by mouth 2 (two) times daily with a meal.      [provider]  ranitidine (ZANTAC) 150 MG tablet Take 150 mg by mouth 2 (two) times daily.      [provider]  tamsulosin (FLOMAX) 0.4 MG CAPS capsule Take 2 capsules (0.8 mg total) by mouth daily. 11/20/13   Ozella RocksMerrell, David J, MD  Tiotropium Bromide Monohydrate (SPIRIVA HANDIHALER IN) Inhale into the lungs.    [provider]    Family History History reviewed. No pertinent family history.  Social History Social History   Tobacco Use  . Smoking status: Current Every Day Smoker    Packs/day: 1.00  Types: Cigarettes  . Smokeless tobacco: Never Used  Substance Use Topics  . Alcohol use: No  . Drug use: No     Allergies   Azithromycin   Review of Systems Review of Systems  HENT: Positive for congestion. Negative for sore throat.   Respiratory: Positive for cough (Chronic) and shortness of breath. Negative for wheezing.   Cardiovascular: Negative for chest pain.  All other systems reviewed and are negative.    Physical Exam Updated Vital Signs BP 124/83   Pulse (!) 104   Temp (!) 97.4 F (36.3 C) (Oral)    Resp 20   SpO2 94%   Physical Exam  Constitutional: He is oriented to person, place, and time. He appears well-developed and well-nourished. No distress.  Non-toxic appearing.  HENT:  Head: Normocephalic and atraumatic.  Cardiovascular: Normal heart sounds.  No murmur heard. Mildly tachycardic, but regular.  Pulmonary/Chest: Effort normal. No respiratory distress.  Faint expiratory wheezing. Speaking in full sentences with out difficulty.   Abdominal: Soft. He exhibits no distension. There is no tenderness.  Musculoskeletal: He exhibits no edema.  Neurological: He is alert and oriented to person, place, and time.  Skin: Skin is warm and dry.  Nursing note and vitals reviewed.    ED Treatments / Results  Labs (all labs ordered are listed, but only abnormal results are displayed) Labs Reviewed  BASIC METABOLIC PANEL - Abnormal; Notable for the following components:      Result Value   Glucose, Bld 100 (*)    BUN 24 (*)    Calcium 8.5 (*)    All other components within normal limits  CBC    EKG  EKG Interpretation  Date/Time:  Tuesday February 06 2017 09:12:47 EST Ventricular Rate:  109 PR Interval:  130 QRS Duration: 86 QT Interval:  340 QTC Calculation: 457 R Axis:   87 Text Interpretation:  Sinus tachycardia Right atrial enlargement Borderline ECG No significant change since last tracing Confirmed by Shaune Pollack [phone]) on 02/06/2017 1:27:22 PM       Radiology No results found.  Procedures Procedures (including critical care time)  Medications Ordered in ED Medications  albuterol (PROVENTIL, VENTOLIN) (5 MG/ML) 0.5% continuous inhalation solution (not administered)  albuterol (PROVENTIL) (2.5 MG/3ML) 0.083% nebulizer solution 5 mg (5 mg Nebulization Given 02/06/17 0918)     Initial Impression / Assessment and Plan / ED Course  I have reviewed the triage vital signs and the nursing notes.  Pertinent labs & imaging results that were available during  my care of the patient were reviewed by me and considered in my medical decision making (see chart for details).    Brent Good is a 71 y.o. male with hx of COPD who presents to ED for shortness of breath, cough, congestion x 2-3 days. Faint expiratory wheezing on exam. Duo nebs and steroids given in ED. Patient ambulated in ED and maintained oxygen sats > 93%. CXR negative. Labs reassuring. Patient feels comfortable with discharge to home with close PCP follow up. Will start on doxycyline and steroid burst. Patient and family at bedside aware of plan of care and return precautions. All questions answered.    Patient seen by and discussed with Dr. Erma Heritage who agrees with treatment plan.   Final Clinical Impressions(s) / ED Diagnoses   Final diagnoses:  Shortness of breath    ED Discharge Orders    None       Brock Larmon, Chase Picket, PA-C 02/06/17 1509    Erma Heritage,  Sheria Langameron, MD 02/07/17 [phone]

## 2017-02-06 NOTE — ED Triage Notes (Signed)
Pt to ER for two days worsening shortness of breath. Hx of COPD. Not oxygen dependent. VSS. Wheezing noted. Pt able to speak in clear sentences. States has felt body aches and a "head cold" x2 days

## 2017-02-09 ENCOUNTER — Telehealth: Payer: Self-pay | Admitting: *Deleted

## 2017-02-09 ENCOUNTER — Telehealth: Payer: Self-pay | Admitting: General Practice

## 2017-02-09 NOTE — Telephone Encounter (Signed)
Will take a few weeks to get better - if not running fever nothing else we can do.

## 2017-02-09 NOTE — Telephone Encounter (Signed)
Pt sister requested [facility] to [facility] pt record and have covered by benefits.

## 2017-02-09 NOTE — Telephone Encounter (Signed)
Offered patient appt and states that he was to sick to come in and he has to many bills and can not afford it. If patient does not answer call right back it takes him awhile to get home.

## 2017-02-24 DIAGNOSIS — K56609 Unspecified intestinal obstruction, unspecified as to partial versus complete obstruction: Secondary | ICD-10-CM

## 2017-02-24 HISTORY — DX: Unspecified intestinal obstruction, unspecified as to partial versus complete obstruction: K56.609

## 2017-03-11 DIAGNOSIS — I10 Essential (primary) hypertension: Secondary | ICD-10-CM | POA: Diagnosis not present

## 2017-03-11 DIAGNOSIS — F17200 Nicotine dependence, unspecified, uncomplicated: Secondary | ICD-10-CM | POA: Diagnosis not present

## 2017-03-11 DIAGNOSIS — R1084 Generalized abdominal pain: Secondary | ICD-10-CM | POA: Diagnosis not present

## 2017-03-11 DIAGNOSIS — N400 Enlarged prostate without lower urinary tract symptoms: Secondary | ICD-10-CM | POA: Diagnosis not present

## 2017-03-11 DIAGNOSIS — J449 Chronic obstructive pulmonary disease, unspecified: Secondary | ICD-10-CM | POA: Diagnosis not present

## 2017-03-11 DIAGNOSIS — Z79899 Other long term (current) drug therapy: Secondary | ICD-10-CM | POA: Diagnosis not present

## 2017-03-11 DIAGNOSIS — K219 Gastro-esophageal reflux disease without esophagitis: Secondary | ICD-10-CM | POA: Diagnosis not present

## 2017-03-12 ENCOUNTER — Inpatient Hospital Stay (HOSPITAL_COMMUNITY): Payer: Medicare Other

## 2017-03-12 ENCOUNTER — Encounter (HOSPITAL_COMMUNITY): Payer: Self-pay | Admitting: Emergency Medicine

## 2017-03-12 ENCOUNTER — Emergency Department (HOSPITAL_COMMUNITY): Payer: Medicare Other

## 2017-03-12 ENCOUNTER — Telehealth: Payer: Self-pay | Admitting: Nurse Practitioner

## 2017-03-12 ENCOUNTER — Inpatient Hospital Stay (HOSPITAL_COMMUNITY)
Admission: EM | Admit: 2017-03-12 | Discharge: 2017-03-16 | DRG: 389 | Disposition: A | Discharge status: Home / Self Care | Payer: Medicare Other | Attending: Internal Medicine | Admitting: Internal Medicine

## 2017-03-12 ENCOUNTER — Other Ambulatory Visit: Payer: Self-pay

## 2017-03-12 DIAGNOSIS — R079 Chest pain, unspecified: Secondary | ICD-10-CM | POA: Diagnosis not present

## 2017-03-12 DIAGNOSIS — N179 Acute kidney failure, unspecified: Secondary | ICD-10-CM | POA: Diagnosis not present

## 2017-03-12 DIAGNOSIS — I459 Conduction disorder, unspecified: Secondary | ICD-10-CM | POA: Diagnosis present

## 2017-03-12 DIAGNOSIS — R9389 Abnormal findings on diagnostic imaging of other specified body structures: Secondary | ICD-10-CM | POA: Diagnosis present

## 2017-03-12 DIAGNOSIS — K56600 Partial intestinal obstruction, unspecified as to cause: Principal | ICD-10-CM | POA: Diagnosis present

## 2017-03-12 DIAGNOSIS — F17210 Nicotine dependence, cigarettes, uncomplicated: Secondary | ICD-10-CM | POA: Diagnosis present

## 2017-03-12 DIAGNOSIS — Z7951 Long term (current) use of inhaled steroids: Secondary | ICD-10-CM

## 2017-03-12 DIAGNOSIS — K219 Gastro-esophageal reflux disease without esophagitis: Secondary | ICD-10-CM | POA: Diagnosis present

## 2017-03-12 DIAGNOSIS — Z79899 Other long term (current) drug therapy: Secondary | ICD-10-CM | POA: Diagnosis not present

## 2017-03-12 DIAGNOSIS — R000 Tachycardia, unspecified: Secondary | ICD-10-CM | POA: Diagnosis not present

## 2017-03-12 DIAGNOSIS — K56609 Unspecified intestinal obstruction, unspecified as to partial versus complete obstruction: Secondary | ICD-10-CM | POA: Diagnosis not present

## 2017-03-12 DIAGNOSIS — Z0189 Encounter for other specified special examinations: Secondary | ICD-10-CM

## 2017-03-12 DIAGNOSIS — I10 Essential (primary) hypertension: Secondary | ICD-10-CM | POA: Diagnosis present

## 2017-03-12 DIAGNOSIS — J439 Emphysema, unspecified: Secondary | ICD-10-CM | POA: Diagnosis not present

## 2017-03-12 DIAGNOSIS — Z7952 Long term (current) use of systemic steroids: Secondary | ICD-10-CM | POA: Diagnosis not present

## 2017-03-12 DIAGNOSIS — R109 Unspecified abdominal pain: Secondary | ICD-10-CM | POA: Diagnosis not present

## 2017-03-12 DIAGNOSIS — Z7982 Long term (current) use of aspirin: Secondary | ICD-10-CM

## 2017-03-12 DIAGNOSIS — K5669 Other partial intestinal obstruction: Secondary | ICD-10-CM | POA: Diagnosis not present

## 2017-03-12 DIAGNOSIS — Z4682 Encounter for fitting and adjustment of non-vascular catheter: Secondary | ICD-10-CM | POA: Diagnosis not present

## 2017-03-12 DIAGNOSIS — R1110 Vomiting, unspecified: Secondary | ICD-10-CM | POA: Diagnosis not present

## 2017-03-12 HISTORY — DX: Acute kidney failure, unspecified: N17.9

## 2017-03-12 HISTORY — DX: Unspecified intestinal obstruction, unspecified as to partial versus complete obstruction: K56.609

## 2017-03-12 HISTORY — DX: Unspecified osteoarthritis, unspecified site: M19.90

## 2017-03-12 HISTORY — DX: Essential (primary) hypertension: I10

## 2017-03-12 LAB — BASIC METABOLIC PANEL
ANION GAP: 12 (ref 5–15)
BUN: 28 mg/dL — ABNORMAL HIGH (ref 6–20)
CALCIUM: 9.8 mg/dL (ref 8.9–10.3)
CO2: 34 mmol/L — AB (ref 22–32)
Chloride: 91 mmol/L — ABNORMAL LOW (ref 101–111)
Creatinine, Ser: 1.54 mg/dL — ABNORMAL HIGH (ref 0.61–1.24)
GFR, EST AFRICAN AMERICAN: 51 mL/min — AB (ref >60)
GFR, EST NON AFRICAN AMERICAN: 44 mL/min — AB (ref >60)
Glucose, Bld: 120 mg/dL — ABNORMAL HIGH (ref 65–99)
Potassium: 4.3 mmol/L (ref 3.5–5.1)
Sodium: 137 mmol/L (ref 135–145)

## 2017-03-12 LAB — HEPATIC FUNCTION PANEL
ALK PHOS: 89 U/L (ref 38–126)
ALT: 15 U/L — AB (ref 17–63)
AST: 22 U/L (ref 15–41)
Albumin: 3.6 g/dL (ref 3.5–5.0)
BILIRUBIN DIRECT: 0.1 mg/dL (ref 0.1–0.5)
BILIRUBIN INDIRECT: 0.7 mg/dL (ref 0.3–0.9)
BILIRUBIN TOTAL: 0.8 mg/dL (ref 0.3–1.2)
Total Protein: 7 g/dL (ref 6.5–8.1)

## 2017-03-12 LAB — CBC
HCT: 47.9 % (ref 39.0–52.0)
Hemoglobin: 16.3 g/dL (ref 13.0–17.0)
MCH: 31.1 pg (ref 26.0–34.0)
MCHC: 34 g/dL (ref 30.0–36.0)
MCV: 91.4 fL (ref 78.0–100.0)
Platelets: 344 K/uL (ref 150–400)
RBC: 5.24 MIL/uL (ref 4.22–5.81)
RDW: 14 % (ref 11.5–15.5)
WBC: 9.6 K/uL (ref 4.0–10.5)

## 2017-03-12 LAB — I-STAT TROPONIN, ED: TROPONIN I, POC: 0 ng/mL (ref 0.00–0.08)

## 2017-03-12 LAB — LIPASE, BLOOD: Lipase: 20 U/L (ref 11–51)

## 2017-03-12 MED ORDER — ONDANSETRON HCL 4 MG/2ML IJ SOLN: 4.0000 mg | Freq: Four times a day (QID) | INTRAMUSCULAR | Status: DC | PRN

## 2017-03-12 MED ORDER — IOPAMIDOL (ISOVUE-300) INJECTION 61%
75.0000 mL | Freq: Once | INTRAVENOUS | Status: AC | PRN
  Administered 2017-03-12: 75 mL via INTRAVENOUS

## 2017-03-12 MED ORDER — MENTHOL 3 MG MT LOZG: 1.0000 | LOZENGE | OROMUCOSAL | Status: DC | PRN

## 2017-03-12 MED ORDER — DIATRIZOATE MEGLUMINE & SODIUM 66-10 % PO SOLN
90.0000 mL | Freq: Once | ORAL | Status: AC
Start: 2017-03-12 — End: 2017-03-13
  Administered 2017-03-13: 90 mL via NASOGASTRIC
  Filled 2017-03-12: qty 90

## 2017-03-12 MED ORDER — PHENOL 1.4 % MT LIQD: 1.0000 | OROMUCOSAL | Status: DC | PRN

## 2017-03-12 MED ORDER — MOMETASONE FURO-FORMOTEROL FUM 200-5 MCG/ACT IN AERO
2.0000 | INHALATION_SPRAY | Freq: Two times a day (BID) | RESPIRATORY_TRACT | Status: DC
  Administered 2017-03-13 – 2017-03-16 (×6): 2 via RESPIRATORY_TRACT
  Filled 2017-03-12: qty 8.8

## 2017-03-12 MED ORDER — MORPHINE SULFATE (PF) 4 MG/ML IV SOLN
1.0000 mg | INTRAVENOUS | Status: DC | PRN
  Administered 2017-03-13 – 2017-03-15 (×2): 2 mg via INTRAVENOUS
  Filled 2017-03-12 (×2): qty 1

## 2017-03-12 MED ORDER — SODIUM CHLORIDE 0.9 % IV SOLN
INTRAVENOUS | Status: DC
  Administered 2017-03-12: 23:00:00 via INTRAVENOUS

## 2017-03-12 MED ORDER — ONDANSETRON HCL 4 MG/2ML IJ SOLN
4.0000 mg | Freq: Once | INTRAMUSCULAR | Status: AC
  Administered 2017-03-12: 4 mg via INTRAVENOUS
  Filled 2017-03-12: qty 2

## 2017-03-12 MED ORDER — ONDANSETRON HCL 4 MG PO TABS: 4.0000 mg | ORAL_TABLET | Freq: Four times a day (QID) | ORAL | Status: DC | PRN

## 2017-03-12 MED ORDER — HYDRALAZINE HCL 20 MG/ML IJ SOLN: 10.0000 mg | INTRAMUSCULAR | Status: DC | PRN

## 2017-03-12 MED ORDER — SODIUM CHLORIDE 0.9 % IV BOLUS (SEPSIS)
1000.0000 mL | Freq: Once | INTRAVENOUS | Status: AC
  Administered 2017-03-12: 1000 mL via INTRAVENOUS

## 2017-03-12 MED ORDER — IPRATROPIUM BROMIDE 0.02 % IN SOLN
0.5000 mg | Freq: Four times a day (QID) | RESPIRATORY_TRACT | Status: DC
  Administered 2017-03-13 – 2017-03-14 (×5): 0.5 mg via RESPIRATORY_TRACT
  Filled 2017-03-12 (×6): qty 2.5

## 2017-03-12 MED ORDER — FAMOTIDINE IN NACL 20-0.9 MG/50ML-% IV SOLN
20.0000 mg | Freq: Two times a day (BID) | INTRAVENOUS | Status: DC
  Administered 2017-03-13 – 2017-03-15 (×6): 20 mg via INTRAVENOUS
  Filled 2017-03-12 (×6): qty 50

## 2017-03-12 MED ORDER — ACETAMINOPHEN 325 MG PO TABS: 650.0000 mg | ORAL_TABLET | Freq: Four times a day (QID) | ORAL | Status: DC | PRN

## 2017-03-12 MED ORDER — MORPHINE SULFATE (PF) 4 MG/ML IV SOLN
4.0000 mg | Freq: Once | INTRAVENOUS | Status: AC
  Administered 2017-03-12: 4 mg via INTRAVENOUS
  Filled 2017-03-12: qty 1

## 2017-03-12 MED ORDER — ENOXAPARIN SODIUM 40 MG/0.4ML ~~LOC~~ SOLN
40.0000 mg | SUBCUTANEOUS | Status: DC
  Administered 2017-03-14 – 2017-03-16 (×3): 40 mg via SUBCUTANEOUS
  Filled 2017-03-12 (×4): qty 0.4

## 2017-03-12 MED ORDER — ALBUTEROL SULFATE (2.5 MG/3ML) 0.083% IN NEBU
3.0000 mL | INHALATION_SOLUTION | Freq: Four times a day (QID) | RESPIRATORY_TRACT | Status: DC | PRN
Start: 2017-03-12 — End: 2017-03-16
  Administered 2017-03-14: 3 mL via RESPIRATORY_TRACT
  Filled 2017-03-12: qty 3

## 2017-03-12 MED ORDER — ACETAMINOPHEN 650 MG RE SUPP: 650.0000 mg | Freq: Four times a day (QID) | RECTAL | Status: DC | PRN

## 2017-03-12 NOTE — ED Provider Notes (Addendum)
[facility] EMERGENCY DEPARTMENT Provider Note   CSN: [id number] Arrival date & time: 03/12/17  1214     History   Chief Complaint Chief Complaint  Patient presents with  . Chest Pain  . Abdominal Pain    HPI Brent Good is a 71 y.o. male.  HPI  Patient with history of GERD and COPD presenting with chest and abdominal burning with nausea and vomiting and abdominal distention.  He states symptoms began 3 days ago.  He was seen last night at [facility] he states and was treated for GERD.  He states the GI cocktail did not help and he was not able to fill the Carafate prescription he was given.  He was going to the [place] to see his doctor today but then had an episode of emesis so proceeded to the ED.  Emesis is nonbloody and nonbilious.  His last bowel movement was 2 days ago.  He has had no fevers or chills.  He has no change in his baseline shortness of breath.There are no other associated systemic symptoms, there are no other alleviating or modifying factors.   Past Medical History:  Diagnosis Date  . AKI (acute kidney injury) (HCC) 03/12/2017  . Bronchitis   . 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 Essential hypertension 03/12/2017  . GERD (gastroesophageal reflux disease)   . Pneumonia, pneumococcal [facility])     Patient Active Problem List   Diagnosis Date Noted  . SBO (small bowel obstruction) (HCC) 03/12/2017  . AKI (acute kidney injury) (HCC) 03/12/2017  . Essential hypertension 03/12/2017  . Pain in joint, shoulder region 06/08/2015  . Hematuria 11/20/2013  . 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 Urinary hesitancy due to benign prostatic hypertrophy 12/02/2012  . Dyspnea and respiratory abnormality 12/02/2012  . Tobacco user 12/02/2012    Past Surgical History:  Procedure Laterality Date  . INGUINAL HERNIA REPAIR         Home Medications    Prior to  Admission medications   Medication Sig Start Date End Date Taking? Authorizing Provider  ranitidine (ZANTAC) 150 MG tablet Take 150 mg by mouth 2 (two) times daily.     Yes [provider]  albuterol (PROVENTIL HFA;VENTOLIN HFA) 108 (90 Base) MCG/ACT inhaler Inhale 2 puffs into the lungs every 6 (six) hours as needed for wheezing or shortness of breath.    [provider]  amLODipine (NORVASC) 5 MG tablet Take 1 tablet (5 mg total) by mouth daily. 02/27/13   Deatra Canter, FNP  aspirin 325 MG tablet Take 325 mg daily as needed by mouth.    [provider]  budesonide-formoterol (SYMBICORT) 160-4.5 MCG/ACT inhaler Inhale 2 puffs into the lungs 2 (two) times daily. 12/02/12   Ileana Ladd, MD  doxycycline (VIBRAMYCIN) 100 MG capsule Take 1 capsule (100 mg total) 2 (two) times daily by mouth. 02/06/17   Ward, Chase Picket, PA-C  finasteride (PROSCAR) 5 MG tablet Take 1 tablet (5 mg total) by mouth daily. 11/20/13   Ozella Rocks, MD  guaiFENesin (ROBITUSSIN) 100 MG/5ML liquid Take 200 mg 3 (three) times daily as needed by mouth for cough.    [provider]  ipratropium (ATROVENT HFA) 17 MCG/ACT inhaler Inhale 2 puffs into the lungs every 6 (six) hours. 12/02/12   Ileana Ladd, MD  methylPREDNIsolone (MEDROL DOSPACK) 4 MG tablet follow package directions 12/05/13   Junie Spencer, FNP  naproxen (NAPROSYN) 500 MG tablet Take 500 mg 2 (two) times daily with a meal by mouth.    [provider]  naproxen sodium (ANAPROX) 220 MG tablet Take 220 mg by mouth 2 (two) times daily with a meal.      [provider]  predniSONE (DELTASONE) 20 MG tablet Take 2 tablets (40 mg total) daily by mouth. 02/06/17   Ward, Chase Picket, PA-C  tamsulosin (FLOMAX) 0.4 MG CAPS capsule Take 2 capsules (0.8 mg total) by mouth daily. 11/20/13   Ozella Rocks, MD  Tiotropium Bromide Monohydrate (SPIRIVA HANDIHALER IN) Inhale into the lungs.    [provider]    Family History History reviewed. No pertinent family history.  Social History Social History   Tobacco Use  . Smoking status: Current Every Day Smoker    Packs/day: 1.00    Types: Cigarettes  . Smokeless tobacco: Never Used  Substance Use Topics  . Alcohol use: No  . Drug use: No     Allergies   Azithromycin   Review of Systems Review of Systems  ROS reviewed and all otherwise negative except for mentioned in HPI   Physical Exam Updated Vital Signs BP 140/88 (BP Location: Right Arm)   Pulse 88   Temp 98 F (36.7 C) (Oral)   Resp 12   Ht 5\' 8"  (1.727 m)   Wt 77.1 kg (170 lb)   SpO2 92%   BMI 25.85 kg/m  Vitals reviewed Physical Exam Physical Examination: General appearance - alert, well appearing, and in no distress Mental status - alert, oriented to person, place, and time Eyes - no conjunctival injection, no scleral icterus Mouth - mucous membranes moist, pharynx normal without lesions Neck - supple, no significant adenopathy Chest - clear to auscultation, no wheezes, rales or rhonchi, symmetric air entry Heart - normal rate, regular rhythm, normal S1, S2, no murmurs, rubs, clicks or gallops Abdomen - soft, nontender, distended, no masses or organomegaly, + BS Neurological - alert, oriented x 3, moving all extremities Extremities - peripheral pulses normal, no pedal edema, no clubbing or cyanosis Skin - normal coloration and turgor, no rashes  ED Treatments / Results  Labs (all labs ordered are listed, but only abnormal results are displayed) Labs Reviewed  BASIC METABOLIC PANEL - Abnormal; Notable for the following components:      Result Value   Chloride 91 (*)    CO2 34 (*)    Glucose, Bld 120 (*)    BUN 28 (*)    Creatinine, Ser 1.54 (*)    GFR calc non Af Amer 44 (*)    GFR calc Af Amer 51 (*)    All other components within normal limits  HEPATIC FUNCTION PANEL - Abnormal; Notable for the following components:   ALT 15 (*)    All  other components within normal limits  CBC  LIPASE, BLOOD  CBC WITH DIFFERENTIAL/PLATELET  BASIC METABOLIC PANEL  I-STAT TROPONIN, ED    EKG  EKG Interpretation  Date/Time:  Monday March 12 2017 12:31:53 EST Ventricular Rate:  102 PR Interval:  136 QRS Duration: 120 QT Interval:  314 QTC Calculation: 409 R Axis:   91 Text Interpretation:  Sinus tachycardia Possible Left atrial enlargement Rightward axis Non-specific intra-ventricular conduction delay Borderline ECG Since previous tracing QRS interval has increased Confirmed by Jerelyn Scott [phone]) on 03/12/2017 6:08:11 PM       Radiology Dg Chest 2 View  Result Date: 03/12/2017 CLINICAL DATA:  Chest  pain. EXAM: CHEST  2 VIEW COMPARISON:  Radiograph of March 11, 2017. FINDINGS: The heart size and mediastinal contours are within normal limits. Both lungs are clear. No pneumothorax or pleural effusion is noted. The visualized skeletal structures are unremarkable. IMPRESSION: No active cardiopulmonary disease. Electronically Signed   By: Lupita RaiderJames  Green Jr, M.D.   On: 03/12/2017 15:51   Ct Abdomen Pelvis W Contrast  Result Date: 03/12/2017 CLINICAL DATA:  Bowel distention with nausea EXAM: CT ABDOMEN AND PELVIS WITH CONTRAST TECHNIQUE: Multidetector CT imaging of the abdomen and pelvis was performed using the standard protocol following bolus administration of intravenous contrast. CONTRAST:  75mL ISOVUE-300 IOPAMIDOL (ISOVUE-300) INJECTION 61% COMPARISON:  Abdominal radiographs March 12, 2017 FINDINGS: Lower chest: There is mild bibasilar atelectasis. There is no lung base edema or consolidation. There is a small hiatal hernia. Hepatobiliary: No focal liver lesions are appreciable. Gallbladder wall is not appreciably thickened. There is no biliary duct dilatation. Pancreas: No pancreatic mass or inflammatory focus. Spleen: No splenic lesions are evident. Adrenals/Urinary Tract: Adrenals appear normal bilaterally. There are  multiple cysts arising in the left kidney. The largest cyst arises from the lower pole region measuring 6.4 x 5.3 cm. There is no appreciable hydronephrosis on either side. Within the mid right kidney, there is a calculus measuring 8 x 4 mm. There is a calculus in the lower pole left kidney measuring 6 x 5 mm. There is no appreciable ureteral calculus on either side. Urinary bladder is midline with wall thickness within normal limits. Stomach/Bowel: Stomach is distended with fluid and air. There is dilatation of most small bowel loops. There is a transition zone in the mid ileal region in the right anterior pelvis indicative of small bowel obstruction. Vascular/Lymphatic: There is atherosclerotic calcification in the aorta and common iliac arteries. Major mesenteric vessels appear patent. No adenopathy is appreciable in the abdomen or pelvis. Reproductive: Prostate and seminal vesicles appear within normal limits in size and contour. The attenuation of the prostate is somewhat inhomogeneous. A small calculus is noted in the prostate. No well-defined pelvic mass is evident. Other: There is postoperative change with mesh in the lower pelvis. There is fat in the right inguinal ring. There is a minimal ventral hernia containing only fat. Appendix is not appreciable. There is no periappendiceal region inflammation. There is no abscess or ascites in the abdomen or pelvis. Musculoskeletal: There is multifocal degenerative change in the lumbar spine. There are no blastic or lytic bone lesions. There is no intramuscular or abdominal wall lesion. IMPRESSION: 1. Small bowel obstruction with transition zone in the mid ileal region in the anterior right pelvis. No evident free air. 2.  No abscess.  No periappendiceal region inflammation evident. 3. Postoperative change anterior left pelvis. There is an inguinal hernia containing only fat. There is a minimal ventral hernia containing only fat. There is a small hiatal hernia. 4.  Nonobstructing calculi in each kidney. No hydronephrosis or ureteral calculus on either side. 5. Somewhat inhomogeneous appearing prostate. This finding may warrant PSA to further assess. 6.  Aortoiliac atherosclerosis. Aortic Atherosclerosis (ICD10-I70.0). Electronically Signed   By: Bretta BangWilliam  Woodruff III M.D.   On: 03/12/2017 20:29   Dg Abd 2 Views  Result Date: 03/12/2017 CLINICAL DATA:  Abdominal pain with vomiting EXAM: ABDOMEN - 2 VIEW COMPARISON:  June 05, 2007 FINDINGS: Supine and upright images were obtained. There are multiple loops of dilated small bowel. There are no appreciable air-fluid levels. No free air evident. Postoperative changes noted  in the pelvis. Lung bases are clear. IMPRESSION: Multiple loops of dilated small bowel. This appearance is concerning for a degree of bowel obstruction. No evident free air. Postoperative change noted in the pelvis. Electronically Signed   By: Bretta BangWilliam  Woodruff III M.D.   On: 03/12/2017 19:12    Procedures Procedures (including critical care time)  Medications Ordered in ED Medications  sodium chloride 0.9 % bolus 1,000 mL (1,000 mLs Intravenous New Bag/Given 03/12/17 2158)  albuterol (PROVENTIL) (2.5 MG/3ML) 0.083% nebulizer solution 3 mL (not administered)  mometasone-formoterol (DULERA) 200-5 MCG/ACT inhaler 2 puff (not administered)  ipratropium (ATROVENT HFA) inhaler 2 puff (not administered)  famotidine (PEPCID) IVPB 20 mg premix (not administered)  morphine 4 MG/ML injection 1-3 mg (not administered)  enoxaparin (LOVENOX) injection 40 mg (not administered)  0.9 %  sodium chloride infusion (not administered)  acetaminophen (TYLENOL) tablet 650 mg (not administered)    Or  acetaminophen (TYLENOL) suppository 650 mg (not administered)  ondansetron (ZOFRAN) tablet 4 mg (not administered)    Or  ondansetron (ZOFRAN) injection 4 mg (not administered)  hydrALAZINE (APRESOLINE) injection 10 mg (not administered)  iopamidol (ISOVUE-300)  61 % injection 75 mL (75 mLs Intravenous Contrast Given 03/12/17 1944)  morphine 4 MG/ML injection 4 mg (4 mg Intravenous Given 03/12/17 2201)  ondansetron (ZOFRAN) injection 4 mg (4 mg Intravenous Given 03/12/17 2201)     Initial Impression / Assessment and Plan / ED Course  I have reviewed the triage vital signs and the nursing notes.  Pertinent labs & imaging results that were available during my care of the patient were reviewed by me and considered in my medical decision making (see chart for details).    9:10 PM d/w Dr. Dwain SarnaWakefield, surgery- he agrees with medical admission and surgical consult.  He has reviewed the CT.  Patient presenting with abdominal pain vomiting.  He has evidence of small bowel obstruction on abdominal CT.  Patient is n.p.o. in the emergency department.  Surgery was consulted.  NG tube has been ordered all results have been discussed with patient and family at the bedside.  I have discussed with [facility] hospitalist who will admit the patient.  Final Clinical Impressions(s) / ED Diagnoses   Final diagnoses:  Small bowel obstruction [facility](HCC)    ED Discharge Orders    None       Mabe, Latanya MaudlinMartha L, MD 03/12/17 2129    Phillis HaggisMabe, Martha L, MD 03/12/17 2217

## 2017-03-12 NOTE — ED Notes (Signed)
Patient stated that he is willing to have Chest X-Ray. Called, and informed X-Ray department.

## 2017-03-12 NOTE — ED Notes (Signed)
[facility] and does not want another one at this time.

## 2017-03-12 NOTE — H&P (Signed)
History and Physical    Brent Good AVW:[id number] DOB: 07/19/1945 DOA: 03/12/2017  PCP: [facility]   Patient coming from: Home  Chief Complaint: Indigestion, nausea, vomiting  HPI: Brent DinningMelvin Inscoe is a 71 y.o. male with medical history significant for COPD, hypertension, and GERD, now presenting to the emergency department for evaluation of burning discomfort in the upper abdomen and lower chest, nausea, and nonbloody vomiting.  Patient reports that he was in his usual state of health until 4 days ago when he began to experience worsening in his chronic indigestion, as well as nausea.  Symptoms persisted and increased in severity.  He had a loose stool 2 days ago and no bowel movement since.  But nausea has worsened and he has been experiencing nonbloody vomiting.  He denies any recent fevers or chills.  History of 2 hernia surgeries.  Denies history of SBO.  ED Course: Upon arrival to the ED, patient is found to be afebrile, saturating well on room air, and with vitals otherwise stable.  EKG features a sinus tachycardia with rate 102 and nonspecific interventricular conduction delay.,  Up from 1.03 last month.  CBC is unremarkable and troponin is undetectable.  Chest x-rays negative for acute cardiopulmonary disease and CT of the abdomen and pelvis reveals small bowel obstruction with transition in the mid ileal region in the pelvis.  There is no abscess or free air noted on the CT.  Surgery was consulted by the ED physician, 1 L of normal saline NG tube was ordered, and the patient was treated with morphine and Zofran in the ED.  He remains hemodynamically stable and in no apparent respiratory distress.  Surgery has requested a medical admission.  Patient will be admitted to the medical/surgical unit for ongoing evaluation and management of SBO.  Review of Systems:  All other systems reviewed and apart from HPI, are negative.  Past Medical History:  Diagnosis Date  . AKI (acute kidney  injury) (HCC) 03/12/2017  . Bronchitis   . 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 Essential hypertension 03/12/2017  . GERD (gastroesophageal reflux disease)   . Pneumonia, pneumococcal [facility](HCC)     Past Surgical History:  Procedure Laterality Date  . INGUINAL HERNIA REPAIR       reports that he has been smoking cigarettes.  He has been smoking about 1.00 pack per day. he has never used smokeless tobacco. He reports that he does not drink alcohol or use drugs.  Allergies  Allergen Reactions  . Azithromycin     rash    History reviewed. No pertinent family history.   Prior to Admission medications   Medication Sig Start Date End Date Taking? Authorizing Provider  albuterol (PROVENTIL HFA;VENTOLIN HFA) 108 (90 Base) MCG/ACT inhaler Inhale 2 puffs into the lungs every 6 (six) hours as needed for wheezing or shortness of breath.    [provider]  amLODipine (NORVASC) 5 MG tablet Take 1 tablet (5 mg total) by mouth daily. 02/27/13   Deatra Canterxford, William J, FNP  aspirin 325 MG tablet Take 325 mg daily as needed by mouth.    [provider]  budesonide-formoterol (SYMBICORT) 160-4.5 MCG/ACT inhaler Inhale 2 puffs into the lungs 2 (two) times daily. 12/02/12   Ileana LaddWong, Francis P, MD  doxycycline (VIBRAMYCIN) 100 MG capsule Take 1 capsule (100 mg total) 2 (two) times daily by mouth. 02/06/17   Ward, Chase PicketJaime Pilcher, PA-C  finasteride (PROSCAR) 5 MG tablet Take 1 tablet (5 mg  total) by mouth daily. 11/20/13   Ozella RocksMerrell, David J, MD  guaiFENesin (ROBITUSSIN) 100 MG/5ML liquid Take 200 mg 3 (three) times daily as needed by mouth for cough.    [provider]  ipratropium (ATROVENT HFA) 17 MCG/ACT inhaler Inhale 2 puffs into the lungs every 6 (six) hours. 12/02/12   Ileana LaddWong, Francis P, MD  methylPREDNIsolone (MEDROL DOSPACK) 4 MG tablet follow package directions 12/05/13   Jannifer RodneyHawks, Christy A, FNP  naproxen (NAPROSYN) 500 MG tablet Take 500 mg 2 (two)  times daily with a meal by mouth.    [provider]  naproxen sodium (ANAPROX) 220 MG tablet Take 220 mg by mouth 2 (two) times daily with a meal.      [provider]  predniSONE (DELTASONE) 20 MG tablet Take 2 tablets (40 mg total) daily by mouth. 02/06/17   Ward, Chase PicketJaime Pilcher, PA-C  ranitidine (ZANTAC) 150 MG tablet Take 150 mg by mouth 2 (two) times daily.      [provider]  tamsulosin (FLOMAX) 0.4 MG CAPS capsule Take 2 capsules (0.8 mg total) by mouth daily. 11/20/13   Ozella RocksMerrell, David J, MD  Tiotropium Bromide Monohydrate (SPIRIVA HANDIHALER IN) Inhale into the lungs.    [provider]    Physical Exam: Vitals:   03/12/17 1232 03/12/17 1618 03/12/17 1704 03/12/17 1900  BP: (!) 128/99 94/60 (!) 132/99 (!) 137/92  Pulse: (!) 105 (!) 111 94 96  Resp: 20 16 17 14   Temp: 98 F (36.7 C)     TempSrc: Oral     SpO2: 96% 95% 98% 98%  Weight: 77.1 kg (170 lb)     Height: 5\' 8"  (1.727 m)         Constitutional: NAD, calm, in apparent discomfort Eyes: PERTLA, lids and conjunctivae normal ENMT: Mucous membranes are moist. Posterior pharynx clear of any exudate or lesions.   Neck: normal, supple, no masses, no thyromegaly Respiratory: Diminished bilaterally, no wheezing, no crackles. Normal respiratory effort.   Cardiovascular: S1 & S2 heard, regular rate and rhythm. No significant JVD. Abdomen: Mild distension, generalized tenderness, no rebound pain or guarding. Rare bowel sound appreciated.   Musculoskeletal: no clubbing / cyanosis. No joint deformity upper and lower extremities.    Skin: no significant rashes, lesions, ulcers. Poor turgor. Neurologic: CN 2-12 grossly intact. Sensation intact. Strength 5/5 in all 4 limbs.  Psychiatric: Alert and oriented x 3. Calm, cooperative.     Labs on Admission: I have personally reviewed following labs and imaging studies  CBC: Recent Labs  Lab 03/12/17 1244  WBC 9.6  HGB 16.3  HCT 47.9  MCV 91.4   PLT 344   Basic Metabolic Panel: Recent Labs  Lab 03/12/17 1244  NA 137  K 4.3  CL 91*  CO2 34*  GLUCOSE 120*  BUN 28*  CREATININE 1.54*  CALCIUM 9.8   GFR: Estimated Creatinine Clearance: 42.6 mL/min (A) (by C-G formula based on SCr of 1.54 mg/dL (H)). Liver Function Tests: Recent Labs  Lab 03/12/17 1915  AST 22  ALT 15*  ALKPHOS 89  BILITOT 0.8  PROT 7.0  ALBUMIN 3.6   Recent Labs  Lab 03/12/17 1915  LIPASE 20   No results for input(s): AMMONIA in the last 168 hours. Coagulation Profile: No results for input(s): INR, PROTIME in the last 168 hours. Cardiac Enzymes: No results for input(s): CKTOTAL, CKMB, CKMBINDEX, TROPONINI in the last 168 hours. BNP (last 3 results) No results for input(s): PROBNP in the last  8760 hours. HbA1C: No results for input(s): HGBA1C in the last 72 hours. CBG: No results for input(s): GLUCAP in the last 168 hours. Lipid Profile: No results for input(s): CHOL, HDL, LDLCALC, TRIG, CHOLHDL, LDLDIRECT in the last 72 hours. Thyroid Function Tests: No results for input(s): TSH, T4TOTAL, FREET4, T3FREE, THYROIDAB in the last 72 hours. Anemia Panel: No results for input(s): VITAMINB12, FOLATE, FERRITIN, TIBC, IRON, RETICCTPCT in the last 72 hours. Urine analysis:    Component Value Date/Time   BILIRUBINUR NEG 11/20/2013 1031   PROTEINUR 4++++ 11/20/2013 1031   UROBILINOGEN negative 11/20/2013 1031   NITRITE NEG 11/20/2013 1031   LEUKOCYTESUR Negative 11/20/2013 1031   Sepsis Labs: @LABRCNTIP (procalcitonin:4,lacticidven:4) )No results found for this or any previous visit (from the past 240 hour(s)).   Radiological Exams on Admission: Dg Chest 2 View  Result Date: 03/12/2017 CLINICAL DATA:  Chest pain. EXAM: CHEST  2 VIEW COMPARISON:  Radiograph of March 11, 2017. FINDINGS: The heart size and mediastinal contours are within normal limits. Both lungs are clear. No pneumothorax or pleural effusion is noted. The visualized  skeletal structures are unremarkable. IMPRESSION: No active cardiopulmonary disease. Electronically Signed   By: Lupita Raider, M.D.   On: 03/12/2017 15:51   Ct Abdomen Pelvis W Contrast  Result Date: 03/12/2017 CLINICAL DATA:  Bowel distention with nausea EXAM: CT ABDOMEN AND PELVIS WITH CONTRAST TECHNIQUE: Multidetector CT imaging of the abdomen and pelvis was performed using the standard protocol following bolus administration of intravenous contrast. CONTRAST:  75mL ISOVUE-300 IOPAMIDOL (ISOVUE-300) INJECTION 61% COMPARISON:  Abdominal radiographs March 12, 2017 FINDINGS: Lower chest: There is mild bibasilar atelectasis. There is no lung base edema or consolidation. There is a small hiatal hernia. Hepatobiliary: No focal liver lesions are appreciable. Gallbladder wall is not appreciably thickened. There is no biliary duct dilatation. Pancreas: No pancreatic mass or inflammatory focus. Spleen: No splenic lesions are evident. Adrenals/Urinary Tract: Adrenals appear normal bilaterally. There are multiple cysts arising in the left kidney. The largest cyst arises from the lower pole region measuring 6.4 x 5.3 cm. There is no appreciable hydronephrosis on either side. Within the mid right kidney, there is a calculus measuring 8 x 4 mm. There is a calculus in the lower pole left kidney measuring 6 x 5 mm. There is no appreciable ureteral calculus on either side. Urinary bladder is midline with wall thickness within normal limits. Stomach/Bowel: Stomach is distended with fluid and air. There is dilatation of most small bowel loops. There is a transition zone in the mid ileal region in the right anterior pelvis indicative of small bowel obstruction. Vascular/Lymphatic: There is atherosclerotic calcification in the aorta and common iliac arteries. Major mesenteric vessels appear patent. No adenopathy is appreciable in the abdomen or pelvis. Reproductive: Prostate and seminal vesicles appear within normal limits  in size and contour. The attenuation of the prostate is somewhat inhomogeneous. A small calculus is noted in the prostate. No well-defined pelvic mass is evident. Other: There is postoperative change with mesh in the lower pelvis. There is fat in the right inguinal ring. There is a minimal ventral hernia containing only fat. Appendix is not appreciable. There is no periappendiceal region inflammation. There is no abscess or ascites in the abdomen or pelvis. Musculoskeletal: There is multifocal degenerative change in the lumbar spine. There are no blastic or lytic bone lesions. There is no intramuscular or abdominal wall lesion. IMPRESSION: 1. Small bowel obstruction with transition zone in the mid ileal region in the  anterior right pelvis. No evident free air. 2.  No abscess.  No periappendiceal region inflammation evident. 3. Postoperative change anterior left pelvis. There is an inguinal hernia containing only fat. There is a minimal ventral hernia containing only fat. There is a small hiatal hernia. 4. Nonobstructing calculi in each kidney. No hydronephrosis or ureteral calculus on either side. 5. Somewhat inhomogeneous appearing prostate. This finding may warrant PSA to further assess. 6.  Aortoiliac atherosclerosis. Aortic Atherosclerosis (ICD10-I70.0). Electronically Signed   By: Bretta Bang III M.D.   On: 03/12/2017 20:29   Dg Abd 2 Views  Result Date: 03/12/2017 CLINICAL DATA:  Abdominal pain with vomiting EXAM: ABDOMEN - 2 VIEW COMPARISON:  June 05, 2007 FINDINGS: Supine and upright images were obtained. There are multiple loops of dilated small bowel. There are no appreciable air-fluid levels. No free air evident. Postoperative changes noted in the pelvis. Lung bases are clear. IMPRESSION: Multiple loops of dilated small bowel. This appearance is concerning for a degree of bowel obstruction. No evident free air. Postoperative change noted in the pelvis. Electronically Signed   By: Bretta Bang III M.D.   On: 03/12/2017 19:12    EKG: Independently reviewed. Sinus tachycardia (rate 102), non-specific IVCD.   Assessment/Plan  1. Small bowel obstruction   - Pt presents with worsening indigestion, upper abd/lower chest discomfort, and N/V - Found to have SBO on CT without free-air or abscess  - Surgery is consulting and much appreciated  - Plan for bowel-rest, NGT decompression, prn analgesia and antiemetics, incentive spirometry, serial exams    2. Acute kidney injury  - SCr is 1.54 on admission, up from 1.03 last month  - Likely a prerenal azotemia in setting of SBO with anorexia and N/V   - Treated with 1 liter NS in ED  - Continue IVF hydration with normal saline and repeat chem panel in am    3. Hypertension  - BP at goal  - Managed at home with Norvasc - Use hydralazine IVP's prn while NPO    4. COPD  - Stable, no cough, dyspnea, or wheezing  - Continue ICS/LABA, Atrovent, and prn albuterol     DVT prophylaxis: Lovenox Code Status: Full  Family Communication: Discussed with patient Disposition Plan: Admit to med-surg Consults called: Surgery Admission status: Inpatient    Briscoe Deutscher, MD [facility] Pager [phone]  If 7PM-7AM, please contact night-coverage [email or web address] Password [facility]  03/12/2017, 9:32 PM

## 2017-03-12 NOTE — ED Notes (Signed)
Please have patient call his sister for an update when thru with procedure:  Guinevere FerrariMari Lawson @ [phone]

## 2017-03-12 NOTE — ED Triage Notes (Signed)
Pt states he is having chest burning in his central chest. Pt has been having n/v/diarrhea. Pt also feels like his abd is swollen.

## 2017-03-12 NOTE — Consult Note (Signed)
Reason for Consult:sbo Referring Physician: Mabe  Brent Good is an 71 y.o. male.  HPI: 71` yom history of bilateral lap ih repair and an appendectomy for rupture all remotely presents with n/v, no flatus or stool since Friday.  He has never had before. No oral intake. Nothing helping at home.  Very bloated.  Has colonoscopy about five years ago he says was normal.  He came in today as this was not getting better. Had xrays and ct scan that show sbo  Past Medical History:  Diagnosis Date  . AKI (acute kidney injury) ([place]) 03/12/2017  . Bronchitis   . COPD (chronic obstructive pulmonary disease) with emphysema ([place]) 12/02/2012  . Essential hypertension 03/12/2017  . GERD (gastroesophageal reflux disease)   . Pneumonia, pneumococcal [facility])     Past Surgical History:  Procedure Laterality Date  . INGUINAL HERNIA REPAIR      History reviewed. No pertinent family history.  Social History:  reports that he has been smoking cigarettes.  He has been smoking about 1.00 pack per day. he has never used smokeless tobacco. He reports that he does not drink alcohol or use drugs.  Allergies:  Allergies  Allergen Reactions  . Azithromycin     rash    Medications: I have reviewed the patient's current medications.  Results for orders placed or performed during the hospital encounter of 03/12/17 (from the past 48 hour(s))  Basic metabolic panel     Status: Abnormal   Collection Time: 03/12/17 12:44 PM  Result Value Ref Range   Sodium 137 135 - 145 mmol/L   Potassium 4.3 3.5 - 5.1 mmol/L   Chloride 91 (L) 101 - 111 mmol/L   CO2 34 (H) 22 - 32 mmol/L   Glucose, Bld 120 (H) 65 - 99 mg/dL   BUN 28 (H) 6 - 20 mg/dL   Creatinine, Ser 1.54 (H) 0.61 - 1.24 mg/dL   Calcium 9.8 8.9 - 10.3 mg/dL   GFR calc non Af Amer 44 (L) >60 mL/min   GFR calc Af Amer 51 (L) >60 mL/min    Comment: (NOTE) The eGFR has been calculated using the CKD EPI equation. This calculation has not been validated in all  clinical situations. eGFR's persistently <60 mL/min signify possible Chronic Kidney Disease.    Anion gap 12 5 - 15  CBC     Status: None   Collection Time: 03/12/17 12:44 PM  Result Value Ref Range   WBC 9.6 4.0 - 10.5 K/uL   RBC 5.24 4.22 - 5.81 MIL/uL   Hemoglobin 16.3 13.0 - 17.0 g/dL   HCT 47.9 39.0 - 52.0 %   MCV 91.4 78.0 - 100.0 fL   MCH 31.1 26.0 - 34.0 pg   MCHC 34.0 30.0 - 36.0 g/dL   RDW 14.0 11.5 - 15.5 %   Platelets 344 150 - 400 K/uL  I-stat troponin, ED     Status: None   Collection Time: 03/12/17 12:50 PM  Result Value Ref Range   Troponin i, poc 0.00 0.00 - 0.08 ng/mL   Comment 3            Comment: Due to the release kinetics of cTnI, a negative result within the first hours of the onset of symptoms does not rule out myocardial infarction with certainty. If myocardial infarction is still suspected, repeat the test at appropriate intervals.   Hepatic function panel     Status: Abnormal   Collection Time: 03/12/17  7:15 PM  Result  Value Ref Range   Total Protein 7.0 6.5 - 8.1 g/dL   Albumin 3.6 3.5 - 5.0 g/dL   AST 22 15 - 41 U/L   ALT 15 (L) 17 - 63 U/L   Alkaline Phosphatase 89 38 - 126 U/L   Total Bilirubin 0.8 0.3 - 1.2 mg/dL   Bilirubin, Direct 0.1 0.1 - 0.5 mg/dL   Indirect Bilirubin 0.7 0.3 - 0.9 mg/dL  Lipase, blood     Status: None   Collection Time: 03/12/17  7:15 PM  Result Value Ref Range   Lipase 20 11 - 51 U/L    Dg Chest 2 View  Result Date: 03/12/2017 CLINICAL DATA:  Chest pain. EXAM: CHEST  2 VIEW COMPARISON:  Radiograph of March 11, 2017. FINDINGS: The heart size and mediastinal contours are within normal limits. Both lungs are clear. No pneumothorax or pleural effusion is noted. The visualized skeletal structures are unremarkable. IMPRESSION: No active cardiopulmonary disease. Electronically Signed   By: Marijo Conception, M.D.   On: 03/12/2017 15:51   Ct Abdomen Pelvis W Contrast  Result Date: 03/12/2017 CLINICAL DATA:  Bowel  distention with nausea EXAM: CT ABDOMEN AND PELVIS WITH CONTRAST TECHNIQUE: Multidetector CT imaging of the abdomen and pelvis was performed using the standard protocol following bolus administration of intravenous contrast. CONTRAST:  27m ISOVUE-300 IOPAMIDOL (ISOVUE-300) INJECTION 61% COMPARISON:  Abdominal radiographs March 12, 2017 FINDINGS: Lower chest: There is mild bibasilar atelectasis. There is no lung base edema or consolidation. There is a small hiatal hernia. Hepatobiliary: No focal liver lesions are appreciable. Gallbladder wall is not appreciably thickened. There is no biliary duct dilatation. Pancreas: No pancreatic mass or inflammatory focus. Spleen: No splenic lesions are evident. Adrenals/Urinary Tract: Adrenals appear normal bilaterally. There are multiple cysts arising in the left kidney. The largest cyst arises from the lower pole region measuring 6.4 x 5.3 cm. There is no appreciable hydronephrosis on either side. Within the mid right kidney, there is a calculus measuring 8 x 4 mm. There is a calculus in the lower pole left kidney measuring 6 x 5 mm. There is no appreciable ureteral calculus on either side. Urinary bladder is midline with wall thickness within normal limits. Stomach/Bowel: Stomach is distended with fluid and air. There is dilatation of most small bowel loops. There is a transition zone in the mid ileal region in the right anterior pelvis indicative of small bowel obstruction. Vascular/Lymphatic: There is atherosclerotic calcification in the aorta and common iliac arteries. Major mesenteric vessels appear patent. No adenopathy is appreciable in the abdomen or pelvis. Reproductive: Prostate and seminal vesicles appear within normal limits in size and contour. The attenuation of the prostate is somewhat inhomogeneous. A small calculus is noted in the prostate. No well-defined pelvic mass is evident. Other: There is postoperative change with mesh in the lower pelvis. There is  fat in the right inguinal ring. There is a minimal ventral hernia containing only fat. Appendix is not appreciable. There is no periappendiceal region inflammation. There is no abscess or ascites in the abdomen or pelvis. Musculoskeletal: There is multifocal degenerative change in the lumbar spine. There are no blastic or lytic bone lesions. There is no intramuscular or abdominal wall lesion. IMPRESSION: 1. Small bowel obstruction with transition zone in the mid ileal region in the anterior right pelvis. No evident free air. 2.  No abscess.  No periappendiceal region inflammation evident. 3. Postoperative change anterior left pelvis. There is an inguinal hernia containing only fat. There  is a minimal ventral hernia containing only fat. There is a small hiatal hernia. 4. Nonobstructing calculi in each kidney. No hydronephrosis or ureteral calculus on either side. 5. Somewhat inhomogeneous appearing prostate. This finding may warrant PSA to further assess. 6.  Aortoiliac atherosclerosis. Aortic Atherosclerosis (ICD10-I70.0). Electronically Signed   By: Lowella Grip III M.D.   On: 03/12/2017 20:29   Dg Abd 2 Views  Result Date: 03/12/2017 CLINICAL DATA:  Abdominal pain with vomiting EXAM: ABDOMEN - 2 VIEW COMPARISON:  June 05, 2007 FINDINGS: Supine and upright images were obtained. There are multiple loops of dilated small bowel. There are no appreciable air-fluid levels. No free air evident. Postoperative changes noted in the pelvis. Lung bases are clear. IMPRESSION: Multiple loops of dilated small bowel. This appearance is concerning for a degree of bowel obstruction. No evident free air. Postoperative change noted in the pelvis. Electronically Signed   By: Lowella Grip III M.D.   On: 03/12/2017 19:12    Review of Systems  Gastrointestinal: Positive for abdominal pain, nausea and vomiting.  All other systems reviewed and are negative.  Blood pressure 140/88, pulse 88, temperature 98 F (36.7  C), temperature source Oral, resp. rate 12, height _0  (1.727 m), weight 77.1 kg (170 lb), SpO2 92 %. Physical Exam  Vitals reviewed. Constitutional: He is oriented to person, place, and time. He appears well-developed and well-nourished.  HENT:  Head: Normocephalic and atraumatic.  Neck: Neck supple.  Cardiovascular: Normal rate and regular rhythm.  Respiratory: Effort normal and breath sounds normal.  GI: He exhibits distension. Bowel sounds are decreased. There is no tenderness. No hernia. Hernia confirmed negative in the ventral area, confirmed negative in the right inguinal area and confirmed negative in the left inguinal area.  Musculoskeletal: He exhibits no edema.  Lymphadenopathy:    He has no cervical adenopathy.  Neurological: He is alert and oriented to person, place, and time.  Skin: Skin is warm and dry.  Psychiatric: He has a normal mood and affect.    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 Will do sbo protocol, ng tube in already with return of a liter feculent material and feels better.  Discussed indications for surgery being worsening or not getting better  Rolm Bookbinder 03/12/2017, 10:47 PM

## 2017-03-12 NOTE — Telephone Encounter (Signed)
Aware,  Need to be seen.  Last office visit waas 06-08-2015.  [facility] with [facility].

## 2017-03-12 NOTE — ED Notes (Signed)
Pt is being [facility] by [facility] Dwain SarnaWakefield, [facility].  Pt had immediate return of green liquid with NG tube placement.  Pt reports nausea and pain and pressure in his stomach is less since tube placement

## 2017-03-13 ENCOUNTER — Inpatient Hospital Stay (HOSPITAL_COMMUNITY): Payer: Medicare Other

## 2017-03-13 DIAGNOSIS — K56609 Unspecified intestinal obstruction, unspecified as to partial versus complete obstruction: Secondary | ICD-10-CM

## 2017-03-13 LAB — BASIC METABOLIC PANEL
Anion gap: 6 (ref 5–15)
BUN: 32 mg/dL — AB (ref 6–20)
CALCIUM: 8.1 mg/dL — AB (ref 8.9–10.3)
CO2: 27 mmol/L (ref 22–32)
CREATININE: 1.21 mg/dL (ref 0.61–1.24)
Chloride: 103 mmol/L (ref 101–111)
GFR, EST NON AFRICAN AMERICAN: 58 mL/min — AB (ref >60)
Glucose, Bld: 98 mg/dL (ref 65–99)
Potassium: 4 mmol/L (ref 3.5–5.1)
SODIUM: 136 mmol/L (ref 135–145)

## 2017-03-13 LAB — CBG MONITORING, ED: GLUCOSE-CAPILLARY: 78 mg/dL (ref 65–99)

## 2017-03-13 LAB — CBC WITH DIFFERENTIAL/PLATELET
BASOS PCT: 0 %
Basophils Absolute: 0 10*3/uL (ref 0.0–0.1)
EOS ABS: 0.1 10*3/uL (ref 0.0–0.7)
EOS PCT: 1 %
HCT: 42.8 % (ref 39.0–52.0)
Hemoglobin: 14.1 g/dL (ref 13.0–17.0)
LYMPHS ABS: 1 10*3/uL (ref 0.7–4.0)
Lymphocytes Relative: 17 %
MCH: 30.1 pg (ref 26.0–34.0)
MCHC: 32.9 g/dL (ref 30.0–36.0)
MCV: 91.5 fL (ref 78.0–100.0)
MONOS PCT: 20 %
Monocytes Absolute: 1.1 10*3/uL — ABNORMAL HIGH (ref 0.1–1.0)
Neutro Abs: 3.5 10*3/uL (ref 1.7–7.7)
Neutrophils Relative %: 62 %
PLATELETS: 280 10*3/uL (ref 150–400)
RBC: 4.68 MIL/uL (ref 4.22–5.81)
RDW: 14.2 % (ref 11.5–15.5)
WBC: 5.6 10*3/uL (ref 4.0–10.5)

## 2017-03-13 MED ORDER — DEXTROSE-NACL 5-0.9 % IV SOLN
INTRAVENOUS | Status: DC
  Administered 2017-03-13 – 2017-03-14 (×3): via INTRAVENOUS

## 2017-03-13 NOTE — Progress Notes (Signed)
I reviewed the X-rays and it looks like the contrast has gotten to the right colon.  Will check X-ray again in the morning.  Marta LamasJames O. Gae BonWyatt, III, MD, FACS [phone]--pager [phone]--office [facility]

## 2017-03-13 NOTE — ED Notes (Signed)
NG tube found to be disconnected. NG tube reconnected to low intermittent suction.

## 2017-03-13 NOTE — ED Notes (Signed)
Pt had small bowel movement this am

## 2017-03-13 NOTE — Progress Notes (Signed)
PROGRESS NOTE    Brent DinningMelvin Benett  EAV:[id number] DOB: 11/23/1945 DOA: 03/12/2017 PCP: [facility]    Brief Narrative: Brent Good is a 71 y.o. male with medical history significant for COPD, hypertension, and GERD, now presenting to the emergency department for evaluation of burning discomfort in the upper abdomen and lower chest, nausea, and nonbloody vomiting.  Patient reports that he was in his usual state of health until 4 days ago when he began to experience worsening in his chronic indigestion, as well as nausea.  Symptoms persisted and increased in severity.  He had a loose stool 2 days ago and no bowel movement since.  But nausea has worsened and he has been experiencing nonbloody vomiting.  He denies any recent fevers or chills.  History of 2 hernia surgeries.  Denies history of SBO.  ED Course:   EKG features a sinus tachycardia with rate 102 and nonspecific interventricular conduction delay.,  Up from 1.03 last month.  CBC is unremarkable and troponin is undetectable.  Chest x-rays negative for acute cardiopulmonary disease and CT of the abdomen and pelvis reveals small bowel obstruction with transition in the mid ileal region in the pelvis.  There is no abscess or free air noted on the CT.  Surgery was consulted by the ED physician, 1 L of normal saline NG tube was ordered, and the patient was treated with morphine and Zofran in the ED.  He remains hemodynamically stable and in no apparent respiratory distress.    Assessment & Plan:   Principal Problem:   SBO (small bowel obstruction) (HCC) Active Problems:   COPD (chronic obstructive pulmonary disease) with emphysema (HCC)   AKI (acute kidney injury) (HCC)   Essential hypertension  1-SBO:  Presents with abdominal pain, N, V. CT SBO with transition point in mid ileal region.  Continue with IV fluids, IV pepcid.  NPO, NG tube in place.  Appreciate surgery help.  Follow KUB.   AKI:  SCr is 1.54 on admission, up from  1.03 last month  Continue with IV fluids.  Improved with fluids.   HTN; Hydralazine PRN.  Hold norvasc while NPO.   COPD;   Continue ICS/LABA, Atrovent, and prn albuterol    Abnormal appearing prostate on CT; needs follow up.  Hold Flomax while NPO.      DVT prophylaxis: Lovenox Code Status: full code.  Family Communication: care discussed with patient.  Disposition Plan: to be determine  Consultants:   sx   Procedures:none   Antimicrobials: none   Subjective: He relates abdominal pain, is controlled with pain medications.  He has NG tube.   Objective: Vitals:   03/13/17 0500 03/13/17 0600 03/13/17 0800 03/13/17 0846  BP: 135/90 120/76 131/85 131/85  Pulse: 79 78 79 81  Resp: 11 13 12 18   Temp:      TempSrc:      SpO2: 93% 93% 96% 97%  Weight:      Height:        Intake/Output Summary (Last 24 hours) at 03/13/2017 0910 Last data filed at 03/13/2017 0106 Gross per 24 hour  Intake 1050 ml  Output 950 ml  Net 100 ml   Filed Weights   03/12/17 1232  Weight: 77.1 kg (170 lb)    Examination:  General exam: Appears calm and comfortable , with NG tube.  Respiratory system: Clear to auscultation. Respiratory effort normal. Cardiovascular system: S1 & S2 heard, RRR. No JVD, murmurs, rubs, gallops or clicks. No pedal edema. Gastrointestinal system:  distended, soft, no rigidity  Central nervous system: Alert and oriented.  Extremities: Symmetric 5 x 5 power. Skin: No rashes, lesions or ulcers   Data Reviewed: I have personally reviewed following labs and imaging studies  CBC: Recent Labs  Lab 03/12/17 1244 03/13/17 0453  WBC 9.6 5.6  NEUTROABS  --  3.5  HGB 16.3 14.1  HCT 47.9 42.8  MCV 91.4 91.5  PLT 344 280   Basic Metabolic Panel: Recent Labs  Lab 03/12/17 1244 03/13/17 0453  NA 137 136  K 4.3 4.0  CL 91* 103  CO2 34* 27  GLUCOSE 120* 98  BUN 28* 32*  CREATININE 1.54* 1.21  CALCIUM 9.8 8.1*   GFR: Estimated Creatinine  Clearance: 54.2 mL/min (by C-G formula based on SCr of 1.21 mg/dL). Liver Function Tests: Recent Labs  Lab 03/12/17 1915  AST 22  ALT 15*  ALKPHOS 89  BILITOT 0.8  PROT 7.0  ALBUMIN 3.6   Recent Labs  Lab 03/12/17 1915  LIPASE 20   No results for input(s): AMMONIA in the last 168 hours. Coagulation Profile: No results for input(s): INR, PROTIME in the last 168 hours. Cardiac Enzymes: No results for input(s): CKTOTAL, CKMB, CKMBINDEX, TROPONINI in the last 168 hours. BNP (last 3 results) No results for input(s): PROBNP in the last 8760 hours. HbA1C: No results for input(s): HGBA1C in the last 72 hours. CBG: No results for input(s): GLUCAP in the last 168 hours. Lipid Profile: No results for input(s): CHOL, HDL, LDLCALC, TRIG, CHOLHDL, LDLDIRECT in the last 72 hours. Thyroid Function Tests: No results for input(s): TSH, T4TOTAL, FREET4, T3FREE, THYROIDAB in the last 72 hours. Anemia Panel: No results for input(s): VITAMINB12, FOLATE, FERRITIN, TIBC, IRON, RETICCTPCT in the last 72 hours. Sepsis Labs: No results for input(s): PROCALCITON, LATICACIDVEN in the last 168 hours.  No results found for this or any previous visit (from the past 240 hour(s)).       Radiology Studies: Dg Chest 2 View  Result Date: 03/12/2017 CLINICAL DATA:  Chest pain. EXAM: CHEST  2 VIEW COMPARISON:  Radiograph of March 11, 2017. FINDINGS: The heart size and mediastinal contours are within normal limits. Both lungs are clear. No pneumothorax or pleural effusion is noted. The visualized skeletal structures are unremarkable. IMPRESSION: No active cardiopulmonary disease. Electronically Signed   By: Lupita RaiderJames  Green Jr, M.D.   On: 03/12/2017 15:51   Ct Abdomen Pelvis W Contrast  Result Date: 03/12/2017 CLINICAL DATA:  Bowel distention with nausea EXAM: CT ABDOMEN AND PELVIS WITH CONTRAST TECHNIQUE: Multidetector CT imaging of the abdomen and pelvis was performed using the standard protocol  following bolus administration of intravenous contrast. CONTRAST:  75mL ISOVUE-300 IOPAMIDOL (ISOVUE-300) INJECTION 61% COMPARISON:  Abdominal radiographs March 12, 2017 FINDINGS: Lower chest: There is mild bibasilar atelectasis. There is no lung base edema or consolidation. There is a small hiatal hernia. Hepatobiliary: No focal liver lesions are appreciable. Gallbladder wall is not appreciably thickened. There is no biliary duct dilatation. Pancreas: No pancreatic mass or inflammatory focus. Spleen: No splenic lesions are evident. Adrenals/Urinary Tract: Adrenals appear normal bilaterally. There are multiple cysts arising in the left kidney. The largest cyst arises from the lower pole region measuring 6.4 x 5.3 cm. There is no appreciable hydronephrosis on either side. Within the mid right kidney, there is a calculus measuring 8 x 4 mm. There is a calculus in the lower pole left kidney measuring 6 x 5 mm. There is no appreciable ureteral calculus on either  side. Urinary bladder is midline with wall thickness within normal limits. Stomach/Bowel: Stomach is distended with fluid and air. There is dilatation of most small bowel loops. There is a transition zone in the mid ileal region in the right anterior pelvis indicative of small bowel obstruction. Vascular/Lymphatic: There is atherosclerotic calcification in the aorta and common iliac arteries. Major mesenteric vessels appear patent. No adenopathy is appreciable in the abdomen or pelvis. Reproductive: Prostate and seminal vesicles appear within normal limits in size and contour. The attenuation of the prostate is somewhat inhomogeneous. A small calculus is noted in the prostate. No well-defined pelvic mass is evident. Other: There is postoperative change with mesh in the lower pelvis. There is fat in the right inguinal ring. There is a minimal ventral hernia containing only fat. Appendix is not appreciable. There is no periappendiceal region inflammation. There  is no abscess or ascites in the abdomen or pelvis. Musculoskeletal: There is multifocal degenerative change in the lumbar spine. There are no blastic or lytic bone lesions. There is no intramuscular or abdominal wall lesion. IMPRESSION: 1. Small bowel obstruction with transition zone in the mid ileal region in the anterior right pelvis. No evident free air. 2.  No abscess.  No periappendiceal region inflammation evident. 3. Postoperative change anterior left pelvis. There is an inguinal hernia containing only fat. There is a minimal ventral hernia containing only fat. There is a small hiatal hernia. 4. Nonobstructing calculi in each kidney. No hydronephrosis or ureteral calculus on either side. 5. Somewhat inhomogeneous appearing prostate. This finding may warrant PSA to further assess. 6.  Aortoiliac atherosclerosis. Aortic Atherosclerosis (ICD10-I70.0). Electronically Signed   By: Bretta Bang III M.D.   On: 03/12/2017 20:29   Dg Abd 2 Views  Result Date: 03/12/2017 CLINICAL DATA:  Abdominal pain with vomiting EXAM: ABDOMEN - 2 VIEW COMPARISON:  June 05, 2007 FINDINGS: Supine and upright images were obtained. There are multiple loops of dilated small bowel. There are no appreciable air-fluid levels. No free air evident. Postoperative changes noted in the pelvis. Lung bases are clear. IMPRESSION: Multiple loops of dilated small bowel. This appearance is concerning for a degree of bowel obstruction. No evident free air. Postoperative change noted in the pelvis. Electronically Signed   By: Bretta Bang III M.D.   On: 03/12/2017 19:12   Dg Abd Portable 1 View  Result Date: 03/12/2017 CLINICAL DATA:  Nasogastric tube placement EXAM: PORTABLE ABDOMEN - 1 VIEW COMPARISON:  Abdominal radiographs and CT abdomen and pelvis March 12, 2017 FINDINGS: Nasogastric tube tip and side port are in the stomach. There remain multiple loops of dilated bowel consistent with a degree of bowel obstruction. No  evident free air. IMPRESSION: Nasogastric tube tip and side port in stomach. Evidence of bowel obstruction. No free air. Electronically Signed   By: Bretta Bang III M.D.   On: 03/12/2017 23:17        Scheduled Meds: . enoxaparin (LOVENOX) injection  40 mg Subcutaneous Q24H  . ipratropium  0.5 mg Inhalation Q6H  . mometasone-formoterol  2 puff Inhalation BID   Continuous Infusions: . sodium chloride 125 mL/hr at 03/12/17 2240  . famotidine (PEPCID) IV Stopped (03/13/17 0106)     LOS: 1 day    Time spent: 35 minutes.     Alba Cory, MD [facility] Pager [phone]  If 7PM-7AM, please contact night-coverage [email or web address] Password [email or web address] 03/13/2017, 9:10 AM

## 2017-03-13 NOTE — ED Notes (Signed)
Pt wants to stay on stretcher does not want to move to hospital bed at this time

## 2017-03-13 NOTE — ED Notes (Signed)
Called radiology that gastrografin was given at [facility]0110

## 2017-03-13 NOTE — Progress Notes (Signed)
Pt admitted to the unit at 1655. Pt mental status is alert and oriented. Pt oriented to room, staff, and call bell. Skin is intact. Full assessment charted in [facility]. Call bell within reach. Visitor guidelines reviewed w/ pt and/or family.

## 2017-03-14 ENCOUNTER — Encounter (HOSPITAL_COMMUNITY): Payer: Self-pay | Admitting: General Practice

## 2017-03-14 ENCOUNTER — Inpatient Hospital Stay (HOSPITAL_COMMUNITY): Payer: Medicare Other

## 2017-03-14 DIAGNOSIS — N179 Acute kidney failure, unspecified: Secondary | ICD-10-CM

## 2017-03-14 LAB — BASIC METABOLIC PANEL
ANION GAP: 5 (ref 5–15)
BUN: 22 mg/dL — ABNORMAL HIGH (ref 6–20)
CALCIUM: 7.5 mg/dL — AB (ref 8.9–10.3)
CO2: 24 mmol/L (ref 22–32)
CREATININE: 0.84 mg/dL (ref 0.61–1.24)
Chloride: 109 mmol/L (ref 101–111)
GFR calc Af Amer: >60 mL/min (ref >60)
GLUCOSE: 99 mg/dL (ref 65–99)
Potassium: 3.5 mmol/L (ref 3.5–5.1)
Sodium: 138 mmol/L (ref 135–145)

## 2017-03-14 LAB — CBC
HCT: 38.8 % — ABNORMAL LOW (ref 39.0–52.0)
Hemoglobin: 12.7 g/dL — ABNORMAL LOW (ref 13.0–17.0)
MCH: 29.9 pg (ref 26.0–34.0)
MCHC: 32.7 g/dL (ref 30.0–36.0)
MCV: 91.3 fL (ref 78.0–100.0)
PLATELETS: 244 10*3/uL (ref 150–400)
RBC: 4.25 MIL/uL (ref 4.22–5.81)
RDW: 14 % (ref 11.5–15.5)
WBC: 5.3 10*3/uL (ref 4.0–10.5)

## 2017-03-14 LAB — GLUCOSE, CAPILLARY: Glucose-Capillary: 96 mg/dL (ref 65–99)

## 2017-03-14 MED ORDER — BOOST / RESOURCE BREEZE PO LIQD CUSTOM
1.0000 | Freq: Three times a day (TID) | ORAL | Status: DC
  Administered 2017-03-14 – 2017-03-16 (×5): 1 via ORAL

## 2017-03-14 MED ORDER — IPRATROPIUM BROMIDE 0.02 % IN SOLN
0.5000 mg | Freq: Two times a day (BID) | RESPIRATORY_TRACT | Status: DC
  Administered 2017-03-15 – 2017-03-16 (×3): 0.5 mg via RESPIRATORY_TRACT
  Filled 2017-03-14 (×3): qty 2.5

## 2017-03-14 MED ORDER — CALCIUM CARBONATE ANTACID 500 MG PO CHEW
1.0000 | CHEWABLE_TABLET | ORAL | Status: DC | PRN
  Administered 2017-03-14 – 2017-03-15 (×4): 200 mg via ORAL
  Filled 2017-03-14 (×4): qty 1

## 2017-03-14 MED ORDER — POTASSIUM CHLORIDE CRYS ER 20 MEQ PO TBCR
40.0000 meq | EXTENDED_RELEASE_TABLET | Freq: Once | ORAL | Status: AC
  Administered 2017-03-14: 40 meq via ORAL
  Filled 2017-03-14: qty 2

## 2017-03-14 NOTE — Progress Notes (Signed)
CN went to check on patient appears he has eaten a hamburger and fries. Even though on a Full liquid diet. Will continue to monitor.

## 2017-03-14 NOTE — Progress Notes (Signed)
[facility] Progress Note      Assessment/Plan  Principal Problem:   SBO (small bowel obstruction) (HCC) Active Problems:   COPD (chronic obstructive pulmonary disease) with emphysema (HCC)   AKI (acute kidney injury) (HCC)   Essential hypertension  SBO - contrast in colon on today abd film - pull NGT   FEN: clears, advance to fulls as tolerated VTE: SCD's, lovenox ID: none Follow up: TBD  DISPO: pull NGT, start on clears, NGT output 0cc in last 24hrs    LOS: 2 days    Subjective:  CC: loose bowel movements  Pt endorses multiple episodes of watery diarrhea. Abdomen is less bloated. No abdominal pain.   Objective: Vital signs in last 24 hours: Temp:  [98 F (36.7 C)-98.3 F (36.8 C)] 98 F (36.7 C) (12/19 0547) Pulse Rate:  [70-78] 74 (12/19 0547) Resp:  [11-18] 18 (12/19 0547) BP: (117-145)/(69-83) 122/71 (12/19 0547) SpO2:  [95 %-96 %] 96 % (12/19 0547) Weight:  [174 lb 6.1 oz (79.1 kg)] 174 lb 6.1 oz (79.1 kg) (12/19 0500) Last BM Date: 03/13/17  Intake/Output from previous day: 12/18 0701 - 12/19 0700 In: 180 [I.V.:130; IV Piggyback:50] Out: 150 [Urine:150] Intake/Output this shift: No intake/output data recorded.  PE: Gen:  Alert, NAD, pleasant, cooperative Pulm:  Rate and effort normal Abd: Soft, mild distention, +BS, no TTP Skin: no rashes noted, warm and dry   Anti-infectives: Anti-infectives (From admission, onward)   None      Lab Results:  Recent Labs    03/13/17 0453 03/14/17 0255  WBC 5.6 5.3  HGB 14.1 12.7*  HCT 42.8 38.8*  PLT 280 244   BMET Recent Labs    03/13/17 0453 03/14/17 0255  NA 136 138  K 4.0 3.5  CL 103 109  CO2 27 24  GLUCOSE 98 99  BUN 32* 22*  CREATININE 1.21 0.84  CALCIUM 8.1* 7.5*   PT/INR No results for input(s): LABPROT, INR in the last 72 hours. CMP     Component Value Date/Time   NA 138 03/14/2017 0255   K 3.5 03/14/2017 0255   CL 109 03/14/2017 0255   CO2 24  03/14/2017 0255   GLUCOSE 99 03/14/2017 0255   BUN 22 (H) 03/14/2017 0255   CREATININE 0.84 03/14/2017 0255   CALCIUM 7.5 (L) 03/14/2017 0255   PROT 7.0 03/12/2017 1915   ALBUMIN 3.6 03/12/2017 1915   AST 22 03/12/2017 1915   ALT 15 (L) 03/12/2017 1915   ALKPHOS 89 03/12/2017 1915   BILITOT 0.8 03/12/2017 1915   GFRNONAA >60 03/14/2017 0255   GFRAA >60 03/14/2017 0255   Lipase     Component Value Date/Time   LIPASE 20 03/12/2017 1915    Studies/Results: Dg Chest 2 View  Result Date: 03/12/2017 CLINICAL DATA:  Chest pain. EXAM: CHEST  2 VIEW COMPARISON:  Radiograph of March 11, 2017. FINDINGS: The heart size and mediastinal contours are within normal limits. Both lungs are clear. No pneumothorax or pleural effusion is noted. The visualized skeletal structures are unremarkable. IMPRESSION: No active cardiopulmonary disease. Electronically Signed   By: Lupita RaiderJames  Green Jr, M.D.   On: 03/12/2017 15:51   Ct Abdomen Pelvis W Contrast  Result Date: 03/12/2017 CLINICAL DATA:  Bowel distention with nausea EXAM: CT ABDOMEN AND PELVIS WITH CONTRAST TECHNIQUE: Multidetector CT imaging of the abdomen and pelvis was performed using the standard protocol following bolus administration of intravenous contrast. CONTRAST:  75mL ISOVUE-300 IOPAMIDOL (ISOVUE-300) INJECTION 61% COMPARISON:  Abdominal  radiographs March 12, 2017 FINDINGS: Lower chest: There is mild bibasilar atelectasis. There is no lung base edema or consolidation. There is a small hiatal hernia. Hepatobiliary: No focal liver lesions are appreciable. Gallbladder wall is not appreciably thickened. There is no biliary duct dilatation. Pancreas: No pancreatic mass or inflammatory focus. Spleen: No splenic lesions are evident. Adrenals/Urinary Tract: Adrenals appear normal bilaterally. There are multiple cysts arising in the left kidney. The largest cyst arises from the lower pole region measuring 6.4 x 5.3 cm. There is no appreciable  hydronephrosis on either side. Within the mid right kidney, there is a calculus measuring 8 x 4 mm. There is a calculus in the lower pole left kidney measuring 6 x 5 mm. There is no appreciable ureteral calculus on either side. Urinary bladder is midline with wall thickness within normal limits. Stomach/Bowel: Stomach is distended with fluid and air. There is dilatation of most small bowel loops. There is a transition zone in the mid ileal region in the right anterior pelvis indicative of small bowel obstruction. Vascular/Lymphatic: There is atherosclerotic calcification in the aorta and common iliac arteries. Major mesenteric vessels appear patent. No adenopathy is appreciable in the abdomen or pelvis. Reproductive: Prostate and seminal vesicles appear within normal limits in size and contour. The attenuation of the prostate is somewhat inhomogeneous. A small calculus is noted in the prostate. No well-defined pelvic mass is evident. Other: There is postoperative change with mesh in the lower pelvis. There is fat in the right inguinal ring. There is a minimal ventral hernia containing only fat. Appendix is not appreciable. There is no periappendiceal region inflammation. There is no abscess or ascites in the abdomen or pelvis. Musculoskeletal: There is multifocal degenerative change in the lumbar spine. There are no blastic or lytic bone lesions. There is no intramuscular or abdominal wall lesion. IMPRESSION: 1. Small bowel obstruction with transition zone in the mid ileal region in the anterior right pelvis. No evident free air. 2.  No abscess.  No periappendiceal region inflammation evident. 3. Postoperative change anterior left pelvis. There is an inguinal hernia containing only fat. There is a minimal ventral hernia containing only fat. There is a small hiatal hernia. 4. Nonobstructing calculi in each kidney. No hydronephrosis or ureteral calculus on either side. 5. Somewhat inhomogeneous appearing prostate.  This finding may warrant PSA to further assess. 6.  Aortoiliac atherosclerosis. Aortic Atherosclerosis (ICD10-I70.0). Electronically Signed   By: Bretta Bang III M.D.   On: 03/12/2017 20:29   Dg Abd 2 Views  Result Date: 03/12/2017 CLINICAL DATA:  Abdominal pain with vomiting EXAM: ABDOMEN - 2 VIEW COMPARISON:  June 05, 2007 FINDINGS: Supine and upright images were obtained. There are multiple loops of dilated small bowel. There are no appreciable air-fluid levels. No free air evident. Postoperative changes noted in the pelvis. Lung bases are clear. IMPRESSION: Multiple loops of dilated small bowel. This appearance is concerning for a degree of bowel obstruction. No evident free air. Postoperative change noted in the pelvis. Electronically Signed   By: Bretta Bang III M.D.   On: 03/12/2017 19:12   Dg Abd Portable 1v  Result Date: 03/14/2017 CLINICAL DATA:  Abdominal pain. Follow-up of small bowel obstruction. EXAM: PORTABLE ABDOMEN - 1 VIEW COMPARISON:  Supine abdominal radiograph of March 13, 2017 FINDINGS: Previously demonstrated contrast within distended small bowel loops has passed into the colon. No residual soft small bowel contrast is observed. There remain loops of mildly distended gas-filled small bowel in the mid  abdomen. The nasogastric tube tip and proximal port lie at the GE junction. The tube appears to have been partially withdrawn since the previous study. IMPRESSION: Interval passage of small bowel contrast into the right colon. Persistent mildly distended gas-filled small bowel loops in the mid abdomen but overall the appearance of the small bowel has improved. Advancement of the nasogastric tube by at least 10 cm is recommended to assure that the proximal port and tip are well below the GE junction. Electronically Signed   By: David  SwazilandJordan M.D.   On: 03/14/2017 08:51   Dg Abd Portable 1v-small Bowel Obstruction Protocol-initial, 8 Hr Delay  Result Date:  03/13/2017 CLINICAL DATA:  8 hour delayed film. History of small-bowel obstruction. EXAM: PORTABLE ABDOMEN - 1 VIEW COMPARISON:  Abdominal radiograph of March 12, 2017 FINDINGS: There is contrast present within loops of moderately distended small bowel. There are loops distended small bowel loops containing gas but no contrast. Contrast however has traversed the small bowel and reached the normal calibered right colon. No free extraluminal contrast is observed. There is contrast within the urinary bladder from the CT scan yesterday. IMPRESSION: Findings consistent with a fairly high-grade distal small bowel obstruction. No evidence of perforation. Electronically Signed   By: David  [place]Jordan M.D.   On: 03/13/2017 09:38   Dg Abd Portable 1 View  Result Date: 03/12/2017 CLINICAL DATA:  Nasogastric tube placement EXAM: PORTABLE ABDOMEN - 1 VIEW COMPARISON:  Abdominal radiographs and CT abdomen and pelvis March 12, 2017 FINDINGS: Nasogastric tube tip and side port are in the stomach. There remain multiple loops of dilated bowel consistent with a degree of bowel obstruction. No evident free air. IMPRESSION: Nasogastric tube tip and side port in stomach. Evidence of bowel obstruction. No free air. Electronically Signed   By: Bretta BangWilliam  Woodruff III M.D.   On: 03/12/2017 23:17      Jerre SimonJessica L Edmundo Tedesco , [facility] 03/14/2017, 9:14 AM Pager: [phone] Consults: [phone] Mon-Fri 7:00 am-4:30 pm Sat-Sun 7:00 am-11:30 am

## 2017-03-14 NOTE — Progress Notes (Signed)
Spoke to patient's sister Byrd Hesselbachmaria late afternoon to update her. She told me patient told his daughter to bring him a cheeseburger. While in room went to closet to get towel and found empty cheeseburger wrapper and half eaten french fries from [employer]. Cookie in wrapper unopened. Asked patient about it and he said it was his daughters. Daughter not in room. I said then it's ok if I throw it away, he said he didn't care.

## 2017-03-14 NOTE — Progress Notes (Signed)
PROGRESS NOTE    Brent Good  ZOX:[id number] DOB: 07/29/1945 DOA: 03/12/2017 PCP: [facility]    Brief Narrative: Brent Good is a 71 y.o. male with medical history significant for COPD, hypertension, and GERD, now presenting to the emergency department for evaluation of burning discomfort in the upper abdomen and lower chest, nausea, and nonbloody vomiting.  Patient reports that he was in his usual state of health until 4 days ago when he began to experience worsening in his chronic indigestion, as well as nausea.  Symptoms persisted and increased in severity.  He had a loose stool 2 days ago and no bowel movement since.  But nausea has worsened and he has been experiencing nonbloody vomiting.  He denies any recent fevers or chills.  History of 2 hernia surgeries.  Denies history of SBO.  ED Course:   EKG features a sinus tachycardia with rate 102 and nonspecific interventricular conduction delay.,  Up from 1.03 last month.  CBC is unremarkable and troponin is undetectable.  Chest x-rays negative for acute cardiopulmonary disease and CT of the abdomen and pelvis reveals small bowel obstruction with transition in the mid ileal region in the pelvis.  There is no abscess or free air noted on the CT.  Surgery was consulted by the ED physician, 1 L of normal saline NG tube was ordered, and the patient was treated with morphine and Zofran in the ED.  He remains hemodynamically stable and in no apparent respiratory distress.    Assessment & Plan:   Principal Problem:   SBO (small bowel obstruction) (HCC) Active Problems:   COPD (chronic obstructive pulmonary disease) with emphysema (HCC)   AKI (acute kidney injury) (HCC)   Essential hypertension  1-SBO:  Presents with abdominal pain, N, V. CT SBO with transition point in mid ileal region.  Continue with IV fluids, IV pepcid.  NG tube removed. Underwent SBO protocol.  Appreciate surgery help.  Follow KUB with contrast in the colon.    Started on clear diet   AKI:  SCr is 1.54 on admission, up from 1.03 last month  Continue with IV fluids.  Improved with fluids.   HTN; Hydralazine PRN.  Hold norvasc while NPO.   COPD;   Continue ICS/LABA, Atrovent, and prn albuterol    Abnormal appearing prostate on CT; needs follow up.  Hold Flomax while NPO.      DVT prophylaxis: Lovenox Code Status: full code.  Family Communication: care discussed with patient.  Disposition Plan: to be determine  Consultants:   sx   Procedures:none   Antimicrobials: none   Subjective: He is feeling better. Had 2 BM.  Denies abdominal pain. Tolerated clears for lunch.    Objective: Vitals:   03/13/17 1948 03/13/17 2139 03/14/17 0500 03/14/17 0547  BP:  117/70  122/71  Pulse: 70 70  74  Resp: 18 16  18   Temp:  98.3 F (36.8 C)  98 F (36.7 C)  TempSrc:  Oral  Oral  SpO2: 95% 95%  96%  Weight:   79.1 kg (174 lb 6.1 oz)   Height:        Intake/Output Summary (Last 24 hours) at 03/14/2017 1248 Last data filed at 03/14/2017 0046 Gross per 24 hour  Intake 130 ml  Output 150 ml  Net -20 ml   Filed Weights   03/12/17 1232 03/14/17 0500  Weight: 77.1 kg (170 lb) 79.1 kg (174 lb 6.1 oz)    Examination:  General exam: NAD Respiratory system:  CTA, normal respiratory effort.  Cardiovascular system: S 1, S 2 RRR Gastrointestinal system: soft, NT, distended.,  Central nervous system: alert, non focal.  Extremities: symmetric power.  Skin: no rash    Data Reviewed: I have personally reviewed following labs and imaging studies  CBC: Recent Labs  Lab 03/12/17 1244 03/13/17 0453 03/14/17 0255  WBC 9.6 5.6 5.3  NEUTROABS  --  3.5  --   HGB 16.3 14.1 12.7*  HCT 47.9 42.8 38.8*  MCV 91.4 91.5 91.3  PLT 344 280 244   Basic Metabolic Panel: Recent Labs  Lab 03/12/17 1244 03/13/17 0453 03/14/17 0255  NA 137 136 138  K 4.3 4.0 3.5  CL 91* 103 109  CO2 34* 27 24  GLUCOSE 120* 98 99  BUN 28* 32* 22*   CREATININE 1.54* 1.21 0.84  CALCIUM 9.8 8.1* 7.5*   GFR: Estimated Creatinine Clearance: 78 mL/min (by C-G formula based on SCr of 0.84 mg/dL). Liver Function Tests: Recent Labs  Lab 03/12/17 1915  AST 22  ALT 15*  ALKPHOS 89  BILITOT 0.8  PROT 7.0  ALBUMIN 3.6   Recent Labs  Lab 03/12/17 1915  LIPASE 20   No results for input(s): AMMONIA in the last 168 hours. Coagulation Profile: No results for input(s): INR, PROTIME in the last 168 hours. Cardiac Enzymes: No results for input(s): CKTOTAL, CKMB, CKMBINDEX, TROPONINI in the last 168 hours. BNP (last 3 results) No results for input(s): PROBNP in the last 8760 hours. HbA1C: No results for input(s): HGBA1C in the last 72 hours. CBG: Recent Labs  Lab 03/13/17 0921 03/14/17 0814  GLUCAP 78 96   Lipid Profile: No results for input(s): CHOL, HDL, LDLCALC, TRIG, CHOLHDL, LDLDIRECT in the last 72 hours. Thyroid Function Tests: No results for input(s): TSH, T4TOTAL, FREET4, T3FREE, THYROIDAB in the last 72 hours. Anemia Panel: No results for input(s): VITAMINB12, FOLATE, FERRITIN, TIBC, IRON, RETICCTPCT in the last 72 hours. Sepsis Labs: No results for input(s): PROCALCITON, LATICACIDVEN in the last 168 hours.  No results found for this or any previous visit (from the past 240 hour(s)).       Radiology Studies: Dg Chest 2 View  Result Date: 03/12/2017 CLINICAL DATA:  Chest pain. EXAM: CHEST  2 VIEW COMPARISON:  Radiograph of March 11, 2017. FINDINGS: The heart size and mediastinal contours are within normal limits. Both lungs are clear. No pneumothorax or pleural effusion is noted. The visualized skeletal structures are unremarkable. IMPRESSION: No active cardiopulmonary disease. Electronically Signed   By: Lupita Raider, M.D.   On: 03/12/2017 15:51   Ct Abdomen Pelvis W Contrast  Result Date: 03/12/2017 CLINICAL DATA:  Bowel distention with nausea EXAM: CT ABDOMEN AND PELVIS WITH CONTRAST TECHNIQUE:  Multidetector CT imaging of the abdomen and pelvis was performed using the standard protocol following bolus administration of intravenous contrast. CONTRAST:  75mL ISOVUE-300 IOPAMIDOL (ISOVUE-300) INJECTION 61% COMPARISON:  Abdominal radiographs March 12, 2017 FINDINGS: Lower chest: There is mild bibasilar atelectasis. There is no lung base edema or consolidation. There is a small hiatal hernia. Hepatobiliary: No focal liver lesions are appreciable. Gallbladder wall is not appreciably thickened. There is no biliary duct dilatation. Pancreas: No pancreatic mass or inflammatory focus. Spleen: No splenic lesions are evident. Adrenals/Urinary Tract: Adrenals appear normal bilaterally. There are multiple cysts arising in the left kidney. The largest cyst arises from the lower pole region measuring 6.4 x 5.3 cm. There is no appreciable hydronephrosis on either side. Within the mid right  kidney, there is a calculus measuring 8 x 4 mm. There is a calculus in the lower pole left kidney measuring 6 x 5 mm. There is no appreciable ureteral calculus on either side. Urinary bladder is midline with wall thickness within normal limits. Stomach/Bowel: Stomach is distended with fluid and air. There is dilatation of most small bowel loops. There is a transition zone in the mid ileal region in the right anterior pelvis indicative of small bowel obstruction. Vascular/Lymphatic: There is atherosclerotic calcification in the aorta and common iliac arteries. Major mesenteric vessels appear patent. No adenopathy is appreciable in the abdomen or pelvis. Reproductive: Prostate and seminal vesicles appear within normal limits in size and contour. The attenuation of the prostate is somewhat inhomogeneous. A small calculus is noted in the prostate. No well-defined pelvic mass is evident. Other: There is postoperative change with mesh in the lower pelvis. There is fat in the right inguinal ring. There is a minimal ventral hernia containing  only fat. Appendix is not appreciable. There is no periappendiceal region inflammation. There is no abscess or ascites in the abdomen or pelvis. Musculoskeletal: There is multifocal degenerative change in the lumbar spine. There are no blastic or lytic bone lesions. There is no intramuscular or abdominal wall lesion. IMPRESSION: 1. Small bowel obstruction with transition zone in the mid ileal region in the anterior right pelvis. No evident free air. 2.  No abscess.  No periappendiceal region inflammation evident. 3. Postoperative change anterior left pelvis. There is an inguinal hernia containing only fat. There is a minimal ventral hernia containing only fat. There is a small hiatal hernia. 4. Nonobstructing calculi in each kidney. No hydronephrosis or ureteral calculus on either side. 5. Somewhat inhomogeneous appearing prostate. This finding may warrant PSA to further assess. 6.  Aortoiliac atherosclerosis. Aortic Atherosclerosis (ICD10-I70.0). Electronically Signed   By: Bretta BangWilliam  Woodruff III M.D.   On: 03/12/2017 20:29   Dg Abd 2 Views  Result Date: 03/12/2017 CLINICAL DATA:  Abdominal pain with vomiting EXAM: ABDOMEN - 2 VIEW COMPARISON:  June 05, 2007 FINDINGS: Supine and upright images were obtained. There are multiple loops of dilated small bowel. There are no appreciable air-fluid levels. No free air evident. Postoperative changes noted in the pelvis. Lung bases are clear. IMPRESSION: Multiple loops of dilated small bowel. This appearance is concerning for a degree of bowel obstruction. No evident free air. Postoperative change noted in the pelvis. Electronically Signed   By: Bretta BangWilliam  Woodruff III M.D.   On: 03/12/2017 19:12   Dg Abd Portable 1v  Result Date: 03/14/2017 CLINICAL DATA:  Abdominal pain. Follow-up of small bowel obstruction. EXAM: PORTABLE ABDOMEN - 1 VIEW COMPARISON:  Supine abdominal radiograph of March 13, 2017 FINDINGS: Previously demonstrated contrast within distended small  bowel loops has passed into the colon. No residual soft small bowel contrast is observed. There remain loops of mildly distended gas-filled small bowel in the mid abdomen. The nasogastric tube tip and proximal port lie at the GE junction. The tube appears to have been partially withdrawn since the previous study. IMPRESSION: Interval passage of small bowel contrast into the right colon. Persistent mildly distended gas-filled small bowel loops in the mid abdomen but overall the appearance of the small bowel has improved. Advancement of the nasogastric tube by at least 10 cm is recommended to assure that the proximal port and tip are well below the GE junction. Electronically Signed   By: David  SwazilandJordan M.D.   On: 03/14/2017 08:51  Dg Abd Portable 1v-small Bowel Obstruction Protocol-initial, 8 Hr Delay  Result Date: 03/13/2017 CLINICAL DATA:  8 hour delayed film. History of small-bowel obstruction. EXAM: PORTABLE ABDOMEN - 1 VIEW COMPARISON:  Abdominal radiograph of March 12, 2017 FINDINGS: There is contrast present within loops of moderately distended small bowel. There are loops distended small bowel loops containing gas but no contrast. Contrast however has traversed the small bowel and reached the normal calibered right colon. No free extraluminal contrast is observed. There is contrast within the urinary bladder from the CT scan yesterday. IMPRESSION: Findings consistent with a fairly high-grade distal small bowel obstruction. No evidence of perforation. Electronically Signed   By: David  [place] M.D.   On: 03/13/2017 09:38   Dg Abd Portable 1 View  Result Date: 03/12/2017 CLINICAL DATA:  Nasogastric tube placement EXAM: PORTABLE ABDOMEN - 1 VIEW COMPARISON:  Abdominal radiographs and CT abdomen and pelvis March 12, 2017 FINDINGS: Nasogastric tube tip and side port are in the stomach. There remain multiple loops of dilated bowel consistent with a degree of bowel obstruction. No evident free air.  IMPRESSION: Nasogastric tube tip and side port in stomach. Evidence of bowel obstruction. No free air. Electronically Signed   By: Bretta Bang III M.D.   On: 03/12/2017 23:17        Scheduled Meds: . enoxaparin (LOVENOX) injection  40 mg Subcutaneous Q24H  . feeding supplement  1 Container Oral TID BM  . ipratropium  0.5 mg Inhalation Q6H  . mometasone-formoterol  2 puff Inhalation BID   Continuous Infusions: . dextrose 5 % and 0.9% NaCl 100 mL/hr at 03/14/17 0611  . famotidine (PEPCID) IV Stopped (03/14/17 1231)     LOS: 2 days    Time spent: 35 minutes.     Alba Cory, MD [facility] Pager [phone]  If 7PM-7AM, please contact night-coverage [email or web address] Password [email or web address] 03/14/2017, 12:48 PM

## 2017-03-15 LAB — BASIC METABOLIC PANEL
Anion gap: 5 (ref 5–15)
BUN: 8 mg/dL (ref 6–20)
CHLORIDE: 109 mmol/L (ref 101–111)
CO2: 23 mmol/L (ref 22–32)
CREATININE: 0.77 mg/dL (ref 0.61–1.24)
Calcium: 7.9 mg/dL — ABNORMAL LOW (ref 8.9–10.3)
GFR calc Af Amer: >60 mL/min (ref >60)
GFR calc non Af Amer: >60 mL/min (ref >60)
Glucose, Bld: 110 mg/dL — ABNORMAL HIGH (ref 65–99)
POTASSIUM: 3.3 mmol/L — AB (ref 3.5–5.1)
SODIUM: 137 mmol/L (ref 135–145)

## 2017-03-15 LAB — GLUCOSE, CAPILLARY: GLUCOSE-CAPILLARY: 105 mg/dL — AB (ref 65–99)

## 2017-03-15 MED ORDER — POTASSIUM CHLORIDE CRYS ER 20 MEQ PO TBCR
40.0000 meq | EXTENDED_RELEASE_TABLET | Freq: Once | ORAL | Status: AC
  Administered 2017-03-15: 40 meq via ORAL
  Filled 2017-03-15: qty 2

## 2017-03-15 MED ORDER — PANTOPRAZOLE SODIUM 40 MG PO TBEC
40.0000 mg | DELAYED_RELEASE_TABLET | Freq: Two times a day (BID) | ORAL | Status: DC
  Administered 2017-03-15 – 2017-03-16 (×3): 40 mg via ORAL
  Filled 2017-03-15 (×3): qty 1

## 2017-03-15 NOTE — Progress Notes (Signed)
Expiratory wheezing auscultated in right upper and bilateral lower lobes. Pt refused PRN breathing treatment, no c/o SOB, no signs of acute respiratory distress.

## 2017-03-15 NOTE — Consult Note (Signed)
            Brent Good  03/15/2017  Brent DinningMelvin Good 10/21/1945 [id number]   Wentto see patient at the bedsideto identify possible discharge needs.  Patientreports having "burning discomfort, pressure to chest area and feeling of indigestion whichresulted to this admission.  Patientendorses [facility]. Raphael Good [facility] [place]as theprimary care provider.   [facility] [place] to obtain medications without difficulty.   Patient reportsthat he has beenmanaginghisown medications at home straight out of the containers without any difficulty so far.  Patient statesthat daughter Brent Mo(Mamie) or sister Brent Hesselbach(Maria) are providingtransportation to hisdoctors'appointments.  Patient verbalized thathe lives alone and independent with self care. Patient states that he is the primary caregiver for himself and has nobody to assist him with care at home. Patient is hopeful that once he is ready to discharge that he will be able to take care of his needs at home.  Anticipateddischarge planis to be determined per MD note. According to patient, discharge disposition is pending further treatment interventions.  Patientvoiced understanding to call primary care provider's office when hereturnshome,for a post discharge follow-up within1-2weeksor sooner if needs arise.Patient letter (with PCP's contact number) was provided as his reminder.   Discussed with patientregarding [facility] services available for health management and resources at home buthe reports being able to manage his COPD so far with breathing medications and follow-up with primary care provider when needed. He also mentioned that he was [facility].   Patientexpressed decline for [facility] services includingEMMI calls to follow-up withhisrecovery on different times  that was offered to him.  Encouraged patientto seekreferral from primary care provider to [facility] care management if deemednecessaryand appropriate for services in the future.  [facility] care [facility] information provided for future needs that he may have.   For additional questions please contact:  Karin GoldenLorraine A. Akshaj Besancon, BSN, RN-BC [facility] Good Cell: [phone]

## 2017-03-15 NOTE — Progress Notes (Signed)
Initial Nutrition Assessment  DOCUMENTATION CODES:   Not applicable  INTERVENTION:   Boost Breeze po TID, each supplement provides 250 kcal and 9 grams of protein  NUTRITION DIAGNOSIS:   Inadequate oral intake related to nausea, other (see comment)(abdominal pain) as evidenced by per patient/family report, estimated needs.  GOAL:   Patient will meet greater than or equal to 90% of their needs  MONITOR:   PO intake, Supplement acceptance, Weight trends, I & O's  REASON FOR ASSESSMENT:   Malnutrition Screening Tool    ASSESSMENT:   Pt with PMH of GERD, HTN, COPD presents with abdominal discomfort and N/V found SBO on CT    Pt's diet advanced from full liquids to soft diet today  Pt reports a decrease in appetite r/t abdominal pain and nausea. Pt reports PTA he ate 2 meals per day, gave examples of pancakes and cheeseburgers.  Pt reports with the decreased appetite 7 lbs weight loss in a ~2 months. No recent weight recordings available per chart.  Pt reports nutritional impact symptoms of nausea and diarrhea at visit. After discussion, pt reports he will try the nutritional supplement ordered as it is not "milk-based"  Labs reviewed; K 3.3 Medications reviewed; Protonix   NUTRITION - FOCUSED PHYSICAL EXAM:    Most Recent Value  Orbital Region  Mild depletion  Upper Arm Region  No depletion  Thoracic and Lumbar Region  No depletion  Buccal Region  No depletion  Temple Region  Mild depletion  Clavicle Bone Region  Mild depletion  Clavicle and Acromion Bone Region  No depletion  Scapular Bone Region  Unable to assess  Dorsal Hand  No depletion  Patellar Region  No depletion  Anterior Thigh Region  No depletion  Posterior Calf Region  No depletion  Edema (RD Assessment)  None      Diet Order:  DIET SOFT Room service appropriate? Yes; Fluid consistency: Thin  EDUCATION NEEDS:   No education needs have been identified at this time  Skin:  Skin Assessment:  Reviewed RN Assessment  Last BM:  03/13/17  Height:   Ht Readings from Last 1 Encounters:  03/12/17 5\' 8"  (1.727 m)    Weight:   Wt Readings from Last 1 Encounters:  03/15/17 173 lb 4.5 oz (78.6 kg)    Ideal Body Weight:  70 kg  BMI:  Body mass index is 26.35 kg/m.  Estimated Nutritional Needs:   Kcal:  1950-2150  Protein:  95-110 grams  Fluid:  >/= 2 L/d  Fransisca KaufmannAllison Ioannides, MS, RDN, LDN 03/15/2017 3:57 PM

## 2017-03-15 NOTE — Progress Notes (Signed)
[facility] Progress Note      Assessment/Plan Principal Problem:   SBO (small bowel obstruction) (HCC) Active Problems:   COPD (chronic obstructive pulmonary disease) with emphysema (HCC)   AKI (acute kidney injury) (HCC)   Essential hypertension  SBO - contrast in colon seen in xrays. Appears to be resolving as pt is having BM's.   FEN: soft, protonix BID and tums  VTE: SCD's, lovenox ID: none Follow up: TBD  DISPO: soft diet. Protonix for indigestion     LOS: 3 days    Subjective:  CC: indigestion   Having severe heartburn. Feels the same as his chronic indigestion. He takes pepcid daily. Having diarrhea. No vomiting.   Objective: Vital signs in last 24 hours: Temp:  [98.7 F (37.1 C)] 98.7 F (37.1 C) (12/20 0522) Pulse Rate:  [71-78] 76 (12/20 0522) Resp:  [16-18] 18 (12/20 0522) BP: (125-141)/(76-84) 141/84 (12/20 0522) SpO2:  [95 %-96 %] 96 % (12/20 0834) Weight:  [173 lb 4.5 oz (78.6 kg)] 173 lb 4.5 oz (78.6 kg) (12/20 0522) Last BM Date: 03/13/17  Intake/Output from previous day: 12/19 0701 - 12/20 0700 In: -  Out: 300 [Urine:300] Intake/Output this shift: No intake/output data recorded.  PE: Gen:  Alert, NAD, cooperative Pulm:  Rate and effort normal Abd: Soft, mild distention, +BS, mild TTP in RLQ  Skin: no rashes noted, warm and dry   Anti-infectives: Anti-infectives (From admission, onward)   None      Lab Results:  Recent Labs    03/13/17 0453 03/14/17 0255  WBC 5.6 5.3  HGB 14.1 12.7*  HCT 42.8 38.8*  PLT 280 244   BMET Recent Labs    03/14/17 0255 03/15/17 0226  NA 138 137  K 3.5 3.3*  CL 109 109  CO2 24 23  GLUCOSE 99 110*  BUN 22* 8  CREATININE 0.84 0.77  CALCIUM 7.5* 7.9*   PT/INR No results for input(s): LABPROT, INR in the last 72 hours. CMP     Component Value Date/Time   NA 137 03/15/2017 0226   K 3.3 (L) 03/15/2017 0226   CL 109 03/15/2017 0226   CO2 23 03/15/2017 0226   GLUCOSE 110 (H) 03/15/2017 0226   BUN 8 03/15/2017 0226   CREATININE 0.77 03/15/2017 0226   CALCIUM 7.9 (L) 03/15/2017 0226   PROT 7.0 03/12/2017 1915   ALBUMIN 3.6 03/12/2017 1915   AST 22 03/12/2017 1915   ALT 15 (L) 03/12/2017 1915   ALKPHOS 89 03/12/2017 1915   BILITOT 0.8 03/12/2017 1915   GFRNONAA >60 03/15/2017 0226   GFRAA >60 03/15/2017 0226   Lipase     Component Value Date/Time   LIPASE 20 03/12/2017 1915    Studies/Results: Dg Abd Portable 1v  Result Date: 03/14/2017 CLINICAL DATA:  Abdominal pain. Follow-up of small bowel obstruction. EXAM: PORTABLE ABDOMEN - 1 VIEW COMPARISON:  Supine abdominal radiograph of March 13, 2017 FINDINGS: Previously demonstrated contrast within distended small bowel loops has passed into the colon. No residual soft small bowel contrast is observed. There remain loops of mildly distended gas-filled small bowel in the mid abdomen. The nasogastric tube tip and proximal port lie at the GE junction. The tube appears to have been partially withdrawn since the previous study. IMPRESSION: Interval passage of small bowel contrast into the right colon. Persistent mildly distended gas-filled small bowel loops in the mid abdomen but overall the appearance of the small bowel has improved. Advancement of the nasogastric tube by at  least 10 cm is recommended to assure that the proximal port and tip are well below the GE junction. Electronically Signed   By: David  Swazil[place]Jordan M.D.   On: 03/14/2017 08:51      Jerre SimonJessica L Nekhi Liwanag , [facility] 03/15/2017, 10:48 AM Pager: [phone] Consults: [phone] Mon-Fri 7:00 am-4:30 pm Sat-Sun 7:00 am-11:30 am

## 2017-03-15 NOTE — Progress Notes (Signed)
PROGRESS NOTE    Brent Good  RUE:[id number] DOB: 08/23/1945 DOA: 03/12/2017 PCP: [facility]    Brief Narrative: Brent Good is a 71 y.o. male with medical history significant for COPD, hypertension, and GERD, now presenting to the emergency department for evaluation of burning discomfort in the upper abdomen and lower chest, nausea, and nonbloody vomiting.  Patient reports that he was in his usual state of health until 4 days ago when he began to experience worsening in his chronic indigestion, as well as nausea.  Symptoms persisted and increased in severity.  He had a loose stool 2 days ago and no bowel movement since.  But nausea has worsened and he has been experiencing nonbloody vomiting.  He denies any recent fevers or chills.  History of 2 hernia surgeries.  Denies history of SBO.  ED Course:   EKG features a sinus tachycardia with rate 102 and nonspecific interventricular conduction delay.,  Up from 1.03 last month.  CBC is unremarkable and troponin is undetectable.  Chest x-rays negative for acute cardiopulmonary disease and CT of the abdomen and pelvis reveals small bowel obstruction with transition in the mid ileal region in the pelvis.  There is no abscess or free air noted on the CT.  Surgery was consulted by the ED physician, 1 L of normal saline NG tube was ordered, and the patient was treated with morphine and Zofran in the ED.  He remains hemodynamically stable and in no apparent respiratory distress.    Assessment & Plan:   Principal Problem:   SBO (small bowel obstruction) (HCC) Active Problems:   COPD (chronic obstructive pulmonary disease) with emphysema (HCC)   AKI (acute kidney injury) (HCC)   Essential hypertension  1-SBO:  Presents with abdominal pain, N, V. CT SBO with transition point in mid ileal region.  Continue with IV fluids, protonix.  NG tube removed. Underwent SBO protocol.  Appreciate surgery help.  Follow KUB with contrast in the colon.    He report worsening abdominal pain today. Indigestion.  Monitor overnight.    AKI:  SCr is 1.54 on admission, up from 1.03 last month  Continue with IV fluids.  Improved with fluids.   HTN; Hydralazine PRN.  Hold norvasc while NPO.   COPD;   Continue ICS/LABA, Atrovent, and prn albuterol    Abnormal appearing prostate on CT; needs follow up.  Hold Flomax while NPO.      DVT prophylaxis: Lovenox Code Status: full code.  Family Communication: care discussed with patient.  Disposition Plan: to be determine  Consultants:   sx   Procedures:none   Antimicrobials: none   Subjective: Complaints of abdominal pain today. Ate a hamberger yesterday.     Objective: Vitals:   03/14/17 2013 03/14/17 2156 03/15/17 0522 03/15/17 0834  BP:  125/76 (!) 141/84   Pulse: 71 78 76   Resp: 16 18 18    Temp:  98.7 F (37.1 C) 98.7 F (37.1 C)   TempSrc:  Oral Oral   SpO2: 96% 95% 96% 96%  Weight:   78.6 kg (173 lb 4.5 oz)   Height:        Intake/Output Summary (Last 24 hours) at 03/15/2017 1145 Last data filed at 03/14/2017 1830 Gross per 24 hour  Intake -  Output 300 ml  Net -300 ml   Filed Weights   03/12/17 1232 03/14/17 0500 03/15/17 0522  Weight: 77.1 kg (170 lb) 79.1 kg (174 lb 6.1 oz) 78.6 kg (173 lb 4.5 oz)  Examination:  General exam: NAD Respiratory system: CTA Cardiovascular system: S 1, S 2 RRR Gastrointestinal system: BS present, distended, mild tender.  Central nervous system: non focal.  Extremities: Symmetric  Skin: no rash    Data Reviewed: I have personally reviewed following labs and imaging studies  CBC: Recent Labs  Lab 03/12/17 1244 03/13/17 0453 03/14/17 0255  WBC 9.6 5.6 5.3  NEUTROABS  --  3.5  --   HGB 16.3 14.1 12.7*  HCT 47.9 42.8 38.8*  MCV 91.4 91.5 91.3  PLT 344 280 244   Basic Metabolic Panel: Recent Labs  Lab 03/12/17 1244 03/13/17 0453 03/14/17 0255 03/15/17 0226  NA 137 136 138 137  K 4.3 4.0 3.5 3.3*   CL 91* 103 109 109  CO2 34* 27 24 23   GLUCOSE 120* 98 99 110*  BUN 28* 32* 22* 8  CREATININE 1.54* 1.21 0.84 0.77  CALCIUM 9.8 8.1* 7.5* 7.9*   GFR: Estimated Creatinine Clearance: 81.9 mL/min (by C-G formula based on SCr of 0.77 mg/dL). Liver Function Tests: Recent Labs  Lab 03/12/17 1915  AST 22  ALT 15*  ALKPHOS 89  BILITOT 0.8  PROT 7.0  ALBUMIN 3.6   Recent Labs  Lab 03/12/17 1915  LIPASE 20   No results for input(s): AMMONIA in the last 168 hours. Coagulation Profile: No results for input(s): INR, PROTIME in the last 168 hours. Cardiac Enzymes: No results for input(s): CKTOTAL, CKMB, CKMBINDEX, TROPONINI in the last 168 hours. BNP (last 3 results) No results for input(s): PROBNP in the last 8760 hours. HbA1C: No results for input(s): HGBA1C in the last 72 hours. CBG: Recent Labs  Lab 03/13/17 0921 03/14/17 0814 03/15/17 0748  GLUCAP 78 96 105*   Lipid Profile: No results for input(s): CHOL, HDL, LDLCALC, TRIG, CHOLHDL, LDLDIRECT in the last 72 hours. Thyroid Function Tests: No results for input(s): TSH, T4TOTAL, FREET4, T3FREE, THYROIDAB in the last 72 hours. Anemia Panel: No results for input(s): VITAMINB12, FOLATE, FERRITIN, TIBC, IRON, RETICCTPCT in the last 72 hours. Sepsis Labs: No results for input(s): PROCALCITON, LATICACIDVEN in the last 168 hours.  No results found for this or any previous visit (from the past 240 hour(s)).       Radiology Studies: Dg Abd Portable 1v  Result Date: 03/14/2017 CLINICAL DATA:  Abdominal pain. Follow-up of small bowel obstruction. EXAM: PORTABLE ABDOMEN - 1 VIEW COMPARISON:  Supine abdominal radiograph of March 13, 2017 FINDINGS: Previously demonstrated contrast within distended small bowel loops has passed into the colon. No residual soft small bowel contrast is observed. There remain loops of mildly distended gas-filled small bowel in the mid abdomen. The nasogastric tube tip and proximal port lie at the  GE junction. The tube appears to have been partially withdrawn since the previous study. IMPRESSION: Interval passage of small bowel contrast into the right colon. Persistent mildly distended gas-filled small bowel loops in the mid abdomen but overall the appearance of the small bowel has improved. Advancement of the nasogastric tube by at least 10 cm is recommended to assure that the proximal port and tip are well below the GE junction. Electronically Signed   By: David  [place] M.D.   On: 03/14/2017 08:51        Scheduled Meds: . enoxaparin (LOVENOX) injection  40 mg Subcutaneous Q24H  . feeding supplement  1 Container Oral TID BM  . ipratropium  0.5 mg Inhalation BID  . mometasone-formoterol  2 puff Inhalation BID  . pantoprazole  40  mg Oral BID  . potassium chloride  40 mEq Oral Once   Continuous Infusions: . dextrose 5 % and 0.9% NaCl 75 mL/hr at 03/14/17 1230     LOS: 3 days    Time spent: 35 minutes.     Alba CoryBelkys A Windy Dudek, MD [facility] Pager [phone]  If 7PM-7AM, please contact night-coverage [email or web address] Password [email or web address] 03/15/2017, 11:45 AM

## 2017-03-16 LAB — BASIC METABOLIC PANEL
ANION GAP: 6 (ref 5–15)
BUN: 7 mg/dL (ref 6–20)
CHLORIDE: 107 mmol/L (ref 101–111)
CO2: 21 mmol/L — AB (ref 22–32)
Calcium: 8.1 mg/dL — ABNORMAL LOW (ref 8.9–10.3)
Creatinine, Ser: 0.59 mg/dL — ABNORMAL LOW (ref 0.61–1.24)
GFR calc Af Amer: >60 mL/min (ref >60)
GLUCOSE: 103 mg/dL — AB (ref 65–99)
POTASSIUM: 3.6 mmol/L (ref 3.5–5.1)
Sodium: 134 mmol/L — ABNORMAL LOW (ref 135–145)

## 2017-03-16 LAB — CBC
HEMATOCRIT: 41.7 % (ref 39.0–52.0)
HEMOGLOBIN: 14 g/dL (ref 13.0–17.0)
MCH: 30.2 pg (ref 26.0–34.0)
MCHC: 33.6 g/dL (ref 30.0–36.0)
MCV: 89.9 fL (ref 78.0–100.0)
Platelets: 255 10*3/uL (ref 150–400)
RBC: 4.64 MIL/uL (ref 4.22–5.81)
RDW: 13.9 % (ref 11.5–15.5)
WBC: 9.4 10*3/uL (ref 4.0–10.5)

## 2017-03-16 LAB — GLUCOSE, CAPILLARY: Glucose-Capillary: 105 mg/dL — ABNORMAL HIGH (ref 65–99)

## 2017-03-16 MED ORDER — PANTOPRAZOLE SODIUM 40 MG PO TBEC: 40.0000 mg | DELAYED_RELEASE_TABLET | Freq: Two times a day (BID) | ORAL | 0 refills | Status: DC

## 2017-03-16 MED ORDER — IPRATROPIUM BROMIDE 0.02 % IN SOLN: 0.5000 mg | Freq: Two times a day (BID) | RESPIRATORY_TRACT | 12 refills | Status: AC

## 2017-03-16 NOTE — Discharge Summary (Addendum)
Physician Discharge Summary  Brent DinningMelvin Good WUJ:[id number] DOB: 07/06/1945 DOA: 03/12/2017  PCP: [facility]  Admit date: 03/12/2017 Discharge date: 03/16/2017  Admitted From: Home  Disposition:  Home   Recommendations for Outpatient Follow-up:  1. Follow up with PCP in 1-2 weeks 2. Please obtain BMP/CBC in one week 3. Follow up with GI for evaluation of Reflux.  4. Needs follow up with urology, for prostate.     Discharge Condition: Stable.  CODE STATUS: full code.  Diet recommendation: Heart Healthy   Brief/Interim Summary: Brief Narrative: Brent KosMelvin Tayloris a 71 y.o.malewith medical history significant forCOPD, hypertension, and GERD, now presenting to the emergency department for evaluation of burning discomfort in the upper abdomen and lower chest, nausea, and nonbloody vomiting. Patient reports that he was in his usual state of health until 4 days ago when he began to experience worsening in his chronic indigestion, as well as nausea. Symptoms persisted and increased in severity. He had a loose stool 2 days ago and no bowel movement since. But nausea has worsened and he has been experiencing nonbloody vomiting. He denies any recent fevers or chills. History of 2 hernia surgeries. Denies history of SBO.  ED Course: EKG features a sinus tachycardia with rate 102 and nonspecific interventricular conduction delay., Up from 1.03 last month. CBC is unremarkable and troponin is undetectable. Chest x-rays negative for acute cardiopulmonary disease and CT of the abdomen and pelvis reveals small bowel obstruction with transition in the mid ileal region in the pelvis. There is no abscess or free air noted on the CT. Surgery was consulted by the ED physician, 1 L of normal saline NG tube was ordered, and the patient was treated with morphine and Zofran in the ED. He remains hemodynamically stable and in no apparent respiratory distress.   Assessment & Plan:   Principal  Problem:   SBO (small bowel obstruction) (HCC) Active Problems:   COPD (chronic obstructive pulmonary disease) with emphysema (HCC)   AKI (acute kidney injury) (HCC)   Essential hypertension  1-SBO: partial SBO,unable to clinically determine cause.  Presents with abdominal pain, N, V. CT SBO with transition point in mid ileal region.  Continue with IV fluids, protonix.  NG tube removed. Underwent SBO protocol.  Appreciate surgery help.  Follow KUB with contrast in the colon.  Feeling better, no more nausea, or vomiting. If tolerates lunch plan is to discharge home.    AKI:  SCr is 1.54 on admission, up from 1.03 last month Continue with IV fluids.  Improved with fluids.   HTN; Hydralazine PRN.  Hold norvasc while NPO.   COPD;  Continue ICS/LABA, Atrovent, and prn albuterol  Abnormal appearing prostate on CT; needs follow up.  resume Flomax while NPO.        Discharge Diagnoses:  Principal Problem:   SBO (small bowel obstruction) (HCC) Active Problems:   COPD (chronic obstructive pulmonary disease) with emphysema (HCC)   AKI (acute kidney injury) (HCC)   Essential hypertension    Discharge Instructions  Discharge Instructions    Diet - low sodium heart healthy   Complete by:  As directed    Increase activity slowly   Complete by:  As directed      Allergies as of 03/16/2017      Reactions   Azithromycin Rash      Medication List    STOP taking these medications   amLODipine 5 MG tablet Commonly known as:  NORVASC   ipratropium 17 MCG/ACT inhaler Commonly  known as:  ATROVENT HFA Replaced by:  ipratropium 0.02 % nebulizer solution   naproxen 500 MG tablet Commonly known as:  NAPROSYN   ranitidine 150 MG tablet Commonly known as:  ZANTAC     TAKE these medications   albuterol 108 (90 Base) MCG/ACT inhaler Commonly known as:  PROVENTIL HFA;VENTOLIN HFA Inhale 2 puffs into the lungs every 6 (six) hours as needed for wheezing or  shortness of breath.   budesonide-formoterol 160-4.5 MCG/ACT inhaler Commonly known as:  SYMBICORT Inhale 2 puffs into the lungs 2 (two) times daily.   ipratropium 0.02 % nebulizer solution Commonly known as:  ATROVENT Inhale 2.5 mLs (0.5 mg total) into the lungs 2 (two) times daily. Replaces:  ipratropium 17 MCG/ACT inhaler   pantoprazole 40 MG tablet Commonly known as:  PROTONIX Take 1 tablet (40 mg total) by mouth 2 (two) times daily.   SPIRIVA HANDIHALER IN Inhale 1 capsule into the lungs daily.   tamsulosin 0.4 MG Caps capsule Commonly known as:  FLOMAX Take 2 capsules (0.8 mg total) by mouth daily. What changed:    how much to take  when to take this       Allergies  Allergen Reactions  . Azithromycin Rash    Consultations: Surgery   Procedures/Studies: Dg Chest 2 View  Result Date: 03/12/2017 CLINICAL DATA:  Chest pain. EXAM: CHEST  2 VIEW COMPARISON:  Radiograph of March 11, 2017. FINDINGS: The heart size and mediastinal contours are within normal limits. Both lungs are clear. No pneumothorax or pleural effusion is noted. The visualized skeletal structures are unremarkable. IMPRESSION: No active cardiopulmonary disease. Electronically Signed   By: Lupita Raider, M.D.   On: 03/12/2017 15:51   Ct Abdomen Pelvis W Contrast  Result Date: 03/12/2017 CLINICAL DATA:  Bowel distention with nausea EXAM: CT ABDOMEN AND PELVIS WITH CONTRAST TECHNIQUE: Multidetector CT imaging of the abdomen and pelvis was performed using the standard protocol following bolus administration of intravenous contrast. CONTRAST:  75mL ISOVUE-300 IOPAMIDOL (ISOVUE-300) INJECTION 61% COMPARISON:  Abdominal radiographs March 12, 2017 FINDINGS: Lower chest: There is mild bibasilar atelectasis. There is no lung base edema or consolidation. There is a small hiatal hernia. Hepatobiliary: No focal liver lesions are appreciable. Gallbladder wall is not appreciably thickened. There is no biliary  duct dilatation. Pancreas: No pancreatic mass or inflammatory focus. Spleen: No splenic lesions are evident. Adrenals/Urinary Tract: Adrenals appear normal bilaterally. There are multiple cysts arising in the left kidney. The largest cyst arises from the lower pole region measuring 6.4 x 5.3 cm. There is no appreciable hydronephrosis on either side. Within the mid right kidney, there is a calculus measuring 8 x 4 mm. There is a calculus in the lower pole left kidney measuring 6 x 5 mm. There is no appreciable ureteral calculus on either side. Urinary bladder is midline with wall thickness within normal limits. Stomach/Bowel: Stomach is distended with fluid and air. There is dilatation of most small bowel loops. There is a transition zone in the mid ileal region in the right anterior pelvis indicative of small bowel obstruction. Vascular/Lymphatic: There is atherosclerotic calcification in the aorta and common iliac arteries. Major mesenteric vessels appear patent. No adenopathy is appreciable in the abdomen or pelvis. Reproductive: Prostate and seminal vesicles appear within normal limits in size and contour. The attenuation of the prostate is somewhat inhomogeneous. A small calculus is noted in the prostate. No well-defined pelvic mass is evident. Other: There is postoperative change with mesh in the  lower pelvis. There is fat in the right inguinal ring. There is a minimal ventral hernia containing only fat. Appendix is not appreciable. There is no periappendiceal region inflammation. There is no abscess or ascites in the abdomen or pelvis. Musculoskeletal: There is multifocal degenerative change in the lumbar spine. There are no blastic or lytic bone lesions. There is no intramuscular or abdominal wall lesion. IMPRESSION: 1. Small bowel obstruction with transition zone in the mid ileal region in the anterior right pelvis. No evident free air. 2.  No abscess.  No periappendiceal region inflammation evident. 3.  Postoperative change anterior left pelvis. There is an inguinal hernia containing only fat. There is a minimal ventral hernia containing only fat. There is a small hiatal hernia. 4. Nonobstructing calculi in each kidney. No hydronephrosis or ureteral calculus on either side. 5. Somewhat inhomogeneous appearing prostate. This finding may warrant PSA to further assess. 6.  Aortoiliac atherosclerosis. Aortic Atherosclerosis (ICD10-I70.0). Electronically Signed   By: Bretta Bang III M.D.   On: 03/12/2017 20:29   Dg Abd 2 Views  Result Date: 03/12/2017 CLINICAL DATA:  Abdominal pain with vomiting EXAM: ABDOMEN - 2 VIEW COMPARISON:  June 05, 2007 FINDINGS: Supine and upright images were obtained. There are multiple loops of dilated small bowel. There are no appreciable air-fluid levels. No free air evident. Postoperative changes noted in the pelvis. Lung bases are clear. IMPRESSION: Multiple loops of dilated small bowel. This appearance is concerning for a degree of bowel obstruction. No evident free air. Postoperative change noted in the pelvis. Electronically Signed   By: Bretta Bang III M.D.   On: 03/12/2017 19:12   Dg Abd Portable 1v  Result Date: 03/14/2017 CLINICAL DATA:  Abdominal pain. Follow-up of small bowel obstruction. EXAM: PORTABLE ABDOMEN - 1 VIEW COMPARISON:  Supine abdominal radiograph of March 13, 2017 FINDINGS: Previously demonstrated contrast within distended small bowel loops has passed into the colon. No residual soft small bowel contrast is observed. There remain loops of mildly distended gas-filled small bowel in the mid abdomen. The nasogastric tube tip and proximal port lie at the GE junction. The tube appears to have been partially withdrawn since the previous study. IMPRESSION: Interval passage of small bowel contrast into the right colon. Persistent mildly distended gas-filled small bowel loops in the mid abdomen but overall the appearance of the small bowel has  improved. Advancement of the nasogastric tube by at least 10 cm is recommended to assure that the proximal port and tip are well below the GE junction. Electronically Signed   By: David  [place] M.D.   On: 03/14/2017 08:51   Dg Abd Portable 1v-small Bowel Obstruction Protocol-initial, 8 Hr Delay  Result Date: 03/13/2017 CLINICAL DATA:  8 hour delayed film. History of small-bowel obstruction. EXAM: PORTABLE ABDOMEN - 1 VIEW COMPARISON:  Abdominal radiograph of March 12, 2017 FINDINGS: There is contrast present within loops of moderately distended small bowel. There are loops distended small bowel loops containing gas but no contrast. Contrast however has traversed the small bowel and reached the normal calibered right colon. No free extraluminal contrast is observed. There is contrast within the urinary bladder from the CT scan yesterday. IMPRESSION: Findings consistent with a fairly high-grade distal small bowel obstruction. No evidence of perforation. Electronically Signed   By: David  [place] M.D.   On: 03/13/2017 09:38   Dg Abd Portable 1 View  Result Date: 03/12/2017 CLINICAL DATA:  Nasogastric tube placement EXAM: PORTABLE ABDOMEN - 1 VIEW COMPARISON:  Abdominal  radiographs and CT abdomen and pelvis March 12, 2017 FINDINGS: Nasogastric tube tip and side port are in the stomach. There remain multiple loops of dilated bowel consistent with a degree of bowel obstruction. No evident free air. IMPRESSION: Nasogastric tube tip and side port in stomach. Evidence of bowel obstruction. No free air. Electronically Signed   By: Bretta Bang III M.D.   On: 03/12/2017 23:17     Subjective: Feeling better , denies gerd like symptoms.   Discharge Exam: Vitals:   03/16/17 0650 03/16/17 0949  BP: (!) 118/99   Pulse: 75   Resp:    Temp:    SpO2:  95%   Vitals:   03/16/17 0500 03/16/17 0649 03/16/17 0650 03/16/17 0949  BP:  (!) 143/94 (!) 118/99   Pulse:  79 75   Resp:  12    Temp:  98.4 F  (36.9 C)    TempSrc:  Oral    SpO2:  97%  95%  Weight: 78.1 kg (172 lb 3.2 oz)     Height:        General: Pt is alert, awake, not in acute distress Cardiovascular: RRR, S1/S2 +, no rubs, no gallops Respiratory: CTA bilaterally, no wheezing, no rhonchi Abdominal: Soft, NT, ND, bowel sounds + Extremities: no edema, no cyanosis    The results of significant diagnostics from this hospitalization (including imaging, microbiology, ancillary and laboratory) are listed below for reference.     Microbiology: No results found for this or any previous visit (from the past 240 hour(s)).   Labs: BNP (last 3 results) No results for input(s): BNP in the last 8760 hours. Basic Metabolic Panel: Recent Labs  Lab 03/12/17 1244 03/13/17 0453 03/14/17 0255 03/15/17 0226 03/16/17 0439  NA 137 136 138 137 134*  K 4.3 4.0 3.5 3.3* 3.6  CL 91* 103 109 109 107  CO2 34* 27 24 23  21*  GLUCOSE 120* 98 99 110* 103*  BUN 28* 32* 22* 8 7  CREATININE 1.54* 1.21 0.84 0.77 0.59*  CALCIUM 9.8 8.1* 7.5* 7.9* 8.1*   Liver Function Tests: Recent Labs  Lab 03/12/17 1915  AST 22  ALT 15*  ALKPHOS 89  BILITOT 0.8  PROT 7.0  ALBUMIN 3.6   Recent Labs  Lab 03/12/17 1915  LIPASE 20   No results for input(s): AMMONIA in the last 168 hours. CBC: Recent Labs  Lab 03/12/17 1244 03/13/17 0453 03/14/17 0255 03/16/17 0439  WBC 9.6 5.6 5.3 9.4  NEUTROABS  --  3.5  --   --   HGB 16.3 14.1 12.7* 14.0  HCT 47.9 42.8 38.8* 41.7  MCV 91.4 91.5 91.3 89.9  PLT 344 280 244 255   Cardiac Enzymes: No results for input(s): CKTOTAL, CKMB, CKMBINDEX, TROPONINI in the last 168 hours. BNP: Invalid input(s): POCBNP CBG: Recent Labs  Lab 03/13/17 0921 03/14/17 0814 03/15/17 0748 03/16/17 0754  GLUCAP 78 96 105* 105*   D-Dimer No results for input(s): DDIMER in the last 72 hours. Hgb A1c No results for input(s): HGBA1C in the last 72 hours. Lipid Profile No results for input(s): CHOL, HDL,  LDLCALC, TRIG, CHOLHDL, LDLDIRECT in the last 72 hours. Thyroid function studies No results for input(s): TSH, T4TOTAL, T3FREE, THYROIDAB in the last 72 hours.  Invalid input(s): FREET3 Anemia work up No results for input(s): VITAMINB12, FOLATE, FERRITIN, TIBC, IRON, RETICCTPCT in the last 72 hours. Urinalysis    Component Value Date/Time   BILIRUBINUR NEG 11/20/2013 1031   PROTEINUR 4++++  11/20/2013 1031   UROBILINOGEN negative 11/20/2013 1031   NITRITE NEG 11/20/2013 1031   LEUKOCYTESUR Negative 11/20/2013 1031   Sepsis Labs Invalid input(s): PROCALCITONIN,  WBC,  LACTICIDVEN Microbiology No results found for this or any previous visit (from the past 240 hour(s)).   Time coordinating discharge: Over 30 minutes  SIGNED:   Alba CoryBelkys A Florabel Faulks, MD  [facility] 03/16/2017, 11:43 AM Pager   If 7PM-7AM, please contact night-coverage [email or web address] Password [email or web address]

## 2017-03-16 NOTE — Care Management Important Message (Signed)
Important Message  Patient Details  Name: Brent DinningMelvin Good MRN: [id number] Date of Birth: 01/10/1946   Medicare Important Message Given:  Yes    [name] [name] 03/16/2017, 10:09 AM

## 2017-03-16 NOTE — Progress Notes (Signed)
[facility]/Trauma Progress Note      Assessment/Plan Principal Problem: SBO (small bowel obstruction) (HCC) Active Problems: COPD (chronic obstructive pulmonary disease) with emphysema (HCC) AKI (acute kidney injury) (HCC) Essential hypertension  SBO - contrast in colon seen in xrays. Appears to be resolving as pt had BM's and is still having flatus. - issues of indigestion yesterday appear to have resolved   ONG:EXBMFEN:soft, protonix BID and tums  VTE: SCD's, lovenox WU:XLKG:none Follow up:TBD  DISPO:soft diet. If pt tolerates a soft diet for lunch he is okay for discharge from a surgical standpoint.     LOS: 4 days    Subjective: CC: SBO  Pt states that neb treatments give him indigestion. He is feeling much better today. He had a soft diet for lunch and tolerated that well. He is still having flatus. No BM today. No nausea or vomiting.   Objective: Vital signs in last 24 hours: Temp:  [98.4 F (36.9 C)-99.1 F (37.3 C)] 98.4 F (36.9 C) (12/21 0649) Pulse Rate:  [75-81] 75 (12/21 0650) Resp:  [12-16] 12 (12/21 0649) BP: (118-161)/(87-99) 118/99 (12/21 0650) SpO2:  [96 %-97 %] 97 % (12/21 0649) Weight:  [172 lb 3.2 oz (78.1 kg)] 172 lb 3.2 oz (78.1 kg) (12/21 0500) Last BM Date: 03/15/17  Intake/Output from previous day: 12/20 0701 - 12/21 0700 In: 900 [I.V.:900] Out: -  Intake/Output this shift: Total I/O In: 120 [P.O.:120] Out: -   PE: Gen: Alert, NAD, cooperative, pleasant Pulm:Rate andeffort normal Abd: Soft,mild distention, +BS,no TTP  Skin: no rashes noted, warm and dry   Anti-infectives: Anti-infectives (From admission, onward)   None      Lab Results:  Recent Labs    03/14/17 0255 03/16/17 0439  WBC 5.3 9.4  HGB 12.7* 14.0  HCT 38.8* 41.7  PLT 244 255   BMET Recent Labs    03/15/17 0226 03/16/17 0439  NA 137 134*  K 3.3* 3.6  CL 109 107  CO2 23 21*  GLUCOSE 110* 103*  BUN 8 7  CREATININE 0.77  0.59*  CALCIUM 7.9* 8.1*   PT/INR No results for input(s): LABPROT, INR in the last 72 hours. CMP     Component Value Date/Time   NA 134 (L) 03/16/2017 0439   K 3.6 03/16/2017 0439   CL 107 03/16/2017 0439   CO2 21 (L) 03/16/2017 0439   GLUCOSE 103 (H) 03/16/2017 0439   BUN 7 03/16/2017 0439   CREATININE 0.59 (L) 03/16/2017 0439   CALCIUM 8.1 (L) 03/16/2017 0439   PROT 7.0 03/12/2017 1915   ALBUMIN 3.6 03/12/2017 1915   AST 22 03/12/2017 1915   ALT 15 (L) 03/12/2017 1915   ALKPHOS 89 03/12/2017 1915   BILITOT 0.8 03/12/2017 1915   GFRNONAA >60 03/16/2017 0439   GFRAA >60 03/16/2017 0439   Lipase     Component Value Date/Time   LIPASE 20 03/12/2017 1915    Studies/Results: No results found.    Jerre SimonJessica L Focht , [facility] 03/16/2017, 9:48 AM Pager: [phone] Consults: [phone] Mon-Fri 7:00 am-4:30 pm Sat-Sun 7:00 am-11:30 am

## 2018-03-06 ENCOUNTER — Ambulatory Visit (INDEPENDENT_AMBULATORY_CARE_PROVIDER_SITE_OTHER): Payer: Medicare Other

## 2018-03-06 ENCOUNTER — Ambulatory Visit (INDEPENDENT_AMBULATORY_CARE_PROVIDER_SITE_OTHER): Payer: Medicare Other | Admitting: Family Medicine

## 2018-03-06 ENCOUNTER — Encounter: Payer: Self-pay | Admitting: Family Medicine

## 2018-03-06 VITALS — BP 112/68 | HR 98 | Temp 97.8°F | Ht 68.0 in | Wt 172.0 lb

## 2018-03-06 DIAGNOSIS — R05 Cough: Secondary | ICD-10-CM | POA: Diagnosis not present

## 2018-03-06 DIAGNOSIS — R059 Cough, unspecified: Secondary | ICD-10-CM

## 2018-03-06 DIAGNOSIS — J441 Chronic obstructive pulmonary disease with (acute) exacerbation: Secondary | ICD-10-CM | POA: Diagnosis not present

## 2018-03-06 MED ORDER — BENZONATATE 100 MG PO CAPS: 100.0000 mg | ORAL_CAPSULE | Freq: Two times a day (BID) | ORAL | 0 refills | Status: DC | PRN

## 2018-03-06 MED ORDER — DOXYCYCLINE HYCLATE 100 MG PO TABS: 100.0000 mg | ORAL_TABLET | Freq: Two times a day (BID) | ORAL | 0 refills | Status: AC

## 2018-03-06 MED ORDER — IPRATROPIUM-ALBUTEROL 0.5-2.5 (3) MG/3ML IN SOLN
3.0000 mL | Freq: Once | RESPIRATORY_TRACT | Status: AC
  Administered 2018-03-06: 3 mL via RESPIRATORY_TRACT

## 2018-03-06 NOTE — Progress Notes (Signed)
Subjective: CC: Cough, congestion concern for PNA PCP: [facility] ZOX:WRUEAV W Dain Laseter. is a 72 y.o. male presenting to clinic today for:  1. Cough Patient reports onset of cough and dyspnea on exertion on Saturday.  He notes a productive cough.  Denies any hemoptysis.  He does report wheeze.  No known sick contacts, nausea, vomiting, fevers.  He started taking prednisone Dosepak on Monday and reports that overnight, symptoms have started improving.  He has only been using his albuterol inhaler maybe once daily.  He reports compliance with Symbicort and Spiriva.   ROS: Per HPI  Allergies  Allergen Reactions  . Azithromycin Rash   Past Medical History:  Diagnosis Date  . AKI (acute kidney injury) (HCC) 03/12/2017  . Arthritis   . Bronchitis   . 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 Essential hypertension 03/12/2017  . GERD (gastroesophageal reflux disease)   . Pneumonia, pneumococcal (HCC)   . SBO (small bowel obstruction) (HCC) 02/2017    Current Outpatient Medications:  .  albuterol (PROVENTIL HFA;VENTOLIN HFA) 108 (90 Base) MCG/ACT inhaler, Inhale 2 puffs into the lungs every 6 (six) hours as needed for wheezing or shortness of breath., Disp: , Rfl:  .  amLODipine (NORVASC) 5 MG tablet, Take 5 mg by mouth daily., Disp: , Rfl:  .  budesonide-formoterol (SYMBICORT) 160-4.5 MCG/ACT inhaler, Inhale 2 puffs into the lungs 2 (two) times daily., Disp: 1 Inhaler, Rfl: 3 .  ipratropium (ATROVENT) 0.02 % nebulizer solution, Inhale 2.5 mLs (0.5 mg total) into the lungs 2 (two) times daily., Disp: 75 mL, Rfl: 12 .  naproxen (NAPROSYN) 500 MG tablet, Take 500 mg by mouth 2 (two) times daily with a meal., Disp: , Rfl:  .  omeprazole (PRILOSEC) 20 MG capsule, Take 20 mg by mouth daily., Disp: , Rfl:  .  tamsulosin (FLOMAX) 0.4 MG CAPS capsule, Take 2 capsules (0.8 mg total) by mouth daily. (Patient taking differently: Take 0.4 mg by mouth 2  (two) times daily. ), Disp: 180 capsule, Rfl: 3 .  Tiotropium Bromide Monohydrate (SPIRIVA HANDIHALER IN), Inhale 1 capsule into the lungs daily. , Disp: , Rfl:  Social History   Socioeconomic History  . Marital status: Divorced    Spouse name: Not on file  . Number of children: Not on file  . Years of education: Not on file  . Highest education level: Not on file  Occupational History  . Not on file  Social Needs  . Financial resource strain: Not on file  . Food insecurity:    Worry: Not on file    Inability: Not on file  . Transportation needs:    Medical: Not on file    Non-medical: Not on file  Tobacco Use  . Smoking status: Current Every Day Smoker    Packs/day: 0.50    Years: 60.00    Pack years: 30.00    Types: Cigarettes  . Smokeless tobacco: Never Used  Substance and Sexual Activity  . Alcohol use: No  . Drug use: No  . Sexual activity: Yes  Lifestyle  . Physical activity:    Days per week: Not on file    Minutes per session: Not on file  . Stress: Not on file  Relationships  . Social connections:    Talks on phone: Not on file    Gets together: Not on file    Attends religious service: Not on file    Active member of club  or organization: Not on file    Attends meetings of clubs or organizations: Not on file    Relationship status: Not on file  . Intimate partner violence:    Fear of current or ex partner: Not on file    Emotionally abused: Not on file    Physically abused: Not on file    Forced sexual activity: Not on file  Other Topics Concern  . Not on file  Social History Narrative  . Not on file   No family history on file.  Objective: Office vital signs reviewed. BP 112/68   Pulse 98   Temp 97.8 F (36.6 C) (Oral)   Ht 5\' 8"  (1.727 m)   Wt 172 lb (78 kg)   SpO2 94%   BMI 26.15 kg/m   Physical Examination:  General: Awake, alert, nontoxic, No acute distress HEENT: Normal, sclera white, MMM Cardio: regular rate and rhythm, S1S2  heard, no murmurs appreciated Pulm: Globally decreased breath sounds with expiratory wheezes noted.  No rhonchi or rales.  Normal work of breathing on room air.  Dg Chest 2 View  Result Date: 03/06/2018 CLINICAL DATA:  Cough and COPD EXAM: CHEST - 2 VIEW COMPARISON:  03/12/2017 FINDINGS: The heart size and mediastinal contours are within normal limits. Both lungs are clear. The visualized skeletal structures are unremarkable. IMPRESSION: No active cardiopulmonary disease. Electronically Signed   By: Alcide Clever M.D.   On: 03/06/2018 12:52    Assessment/ Plan: 72 y.o. male   1. COPD with acute exacerbation (HCC) Patient is afebrile nontoxic-appearing.  His oxygenation is 94% on room air.  He has a physical exam that is remarkable for global expiratory wheeze with globally decreased breath sounds.  Chest x-ray is obtained which revealed no evidence of pneumonia.  He was given a DuoNeb here in office and air movement improved but he had persistent expiratory wheeze.  I advised him to continue prednisone Dosepak.  I have added doxycycline.  I have also prescribed him Tessalon Perles.  We discussed that products containing codeine can cause respiratory depression and would potentially exacerbate his respiratory symptoms and therefore these were not prescribed today.  We reviewed that albuterol inhaler should be used 2 puffs every 6 hours scheduled for the next 2 days and as needed as directed.  If symptoms worsen or do not improve he is to be reevaluated.  Reasons for emergent evaluation discussed as well.  He will follow-up with PCP PRN. - ipratropium-albuterol (DUONEB) 0.5-2.5 (3) MG/3ML nebulizer solution 3 mL  2. Cough Likely related to bronchospasm in the setting of COPD exacerbation. - DG Chest 2 View; Future   Orders Placed This Encounter  Procedures  . DG Chest 2 View    Standing Status:   Future    Number of Occurrences:   1    Standing Expiration Date:   05/08/2019    Order Specific  Question:   Reason for Exam (SYMPTOM  OR DIAGNOSIS REQUIRED)    Answer:   cough    Order Specific Question:   Preferred imaging location?    Answer:   Internal    Order Specific Question:   Radiology Contrast Protocol - do NOT remove file path    Answer:   [email or web address]   Meds ordered this encounter  Medications  . ipratropium-albuterol (DUONEB) 0.5-2.5 (3) MG/3ML nebulizer solution 3 mL  . doxycycline (VIBRA-TABS) 100 MG tablet    Sig: Take 1 tablet (100 mg total) by mouth 2 (two) times daily  for 7 days.    Dispense:  14 tablet    Refill:  0  . benzonatate (TESSALON) 100 MG capsule    Sig: Take 1 capsule (100 mg total) by mouth 2 (two) times daily as needed for cough.    Dispense:  20 capsule    Refill:  0      Hulen SkainsM , DO [facility] [phone]

## 2018-03-06 NOTE — Patient Instructions (Signed)
Your chest x-ray did not reveal any pneumonias.  Your lung exam was remarkable for diffuse wheezes.  I think that your COPD is in flare.  I would like you to do the following:  Use your albuterol inhaler (this is your rescue inhaler) 2 puffs every 6 hours scheduled for the next 2 days then use it as needed as directed.  Continue the prednisone that you have been taking  I have prescribed you doxycycline.  Take 1 tablet 2 times per day with food for the next 7 days to cover for any infection  I have prescribed you benzonatate.  You may use this up to 3 times daily if needed for cough.  If your symptoms worsen, you develop high fevers or blood in your cough, please seek immediate medical attention emergency department.   Chronic Obstructive Pulmonary Disease Exacerbation Chronic obstructive pulmonary disease (COPD) is a common lung problem. In COPD, the flow of air from the lungs is limited. COPD exacerbations are times that breathing gets worse and you need extra treatment. Without treatment they can be life threatening. If they happen often, your lungs can become more damaged. If your COPD gets worse, your doctor may treat you with:  Medicines.  Oxygen.  Different ways to clear your airway, such as using a mask.  Follow these instructions at home:  Do not smoke.  Avoid tobacco smoke and other things that bother your lungs.  If given, take your antibiotic medicine as told. Finish the medicine even if you start to feel better.  Only take medicines as told by your doctor.  Drink enough fluids to keep your pee (urine) clear or pale yellow (unless your doctor has told you not to).  Use a cool mist machine (vaporizer).  If you use oxygen or a machine that turns liquid medicine into a mist (nebulizer), continue to use them as told.  Keep up with shots (vaccinations) as told by your doctor.  Exercise regularly.  Eat healthy foods.  Keep all doctor visits as told. Get help right  away if:  You are very short of breath and it gets worse.  You have trouble talking.  You have bad chest pain.  You have blood in your spit (sputum).  You have a fever.  You keep throwing up (vomiting).  You feel weak, or you pass out (faint).  You feel confused.  You keep getting worse. This information is not intended to replace advice given to you by your health care provider. Make sure you discuss any questions you have with your health care provider. Document Released: 03/02/2011 Document Revised: 08/19/2015 Document Reviewed: 11/15/2012 Elsevier Interactive Patient Education  2017 [employer]

## 2018-06-25 ENCOUNTER — Ambulatory Visit (INDEPENDENT_AMBULATORY_CARE_PROVIDER_SITE_OTHER): Payer: Medicare Other | Admitting: Family Medicine

## 2018-06-25 ENCOUNTER — Other Ambulatory Visit: Payer: Self-pay

## 2018-06-25 DIAGNOSIS — R197 Diarrhea, unspecified: Secondary | ICD-10-CM | POA: Diagnosis not present

## 2018-06-25 NOTE — Progress Notes (Signed)
Telephone visit  Subjective: MB:[id number] PCP: [facility] TTS:VXBLTJ Brent Good. is a 73 y.o. male calls for telephone consult today. Patient provides verbal consent for consult held via phone.  Location of patient: home Location of provider: [facility] Others present for call: none  1. Diarrhea Onset Saturday.  He notes he is having one episode of watery diarrhea per day, which seems to occur in the morning time.  Denies any hematochezia or melena.  No associated domino pain, nausea, vomiting or fever.  He has been tolerating fluids without difficulty.  He denies any consumption of undercooked foods, foods left out too long, untreated water.  Symptoms seem to start after eating a hamburger from [employer].  He has taken Rolaids, Kaopectate, Pepto-Bismol and just yesterday started Imodium right ear.  None of the therapies have provided significant relief yet.   ROS: Per HPI  Allergies  Allergen Reactions  . Azithromycin Rash   Past Medical History:  Diagnosis Date  . AKI (acute kidney injury) (HCC) 03/12/2017  . Arthritis   . Bronchitis   . 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 Essential hypertension 03/12/2017  . GERD (gastroesophageal reflux disease)   . Pneumonia, pneumococcal (HCC)   . SBO (small bowel obstruction) (HCC) 02/2017    Current Outpatient Medications:  .  albuterol (PROVENTIL HFA;VENTOLIN HFA) 108 (90 Base) MCG/ACT inhaler, Inhale 2 puffs into the lungs every 6 (six) hours as needed for wheezing or shortness of breath., Disp: , Rfl:  .  amLODipine (NORVASC) 5 MG tablet, Take 5 mg by mouth daily., Disp: , Rfl:  .  benzonatate (TESSALON) 100 MG capsule, Take 1 capsule (100 mg total) by mouth 2 (two) times daily as needed for cough., Disp: 20 capsule, Rfl: 0 .  budesonide-formoterol (SYMBICORT) 160-4.5 MCG/ACT inhaler, Inhale 2 puffs into the lungs 2 (two) times daily., Disp: 1 Inhaler, Rfl: 3 .  ipratropium (ATROVENT) 0.02 %  nebulizer solution, Inhale 2.5 mLs (0.5 mg total) into the lungs 2 (two) times daily., Disp: 75 mL, Rfl: 12 .  naproxen (NAPROSYN) 500 MG tablet, Take 500 mg by mouth 2 (two) times daily with a meal., Disp: , Rfl:  .  omeprazole (PRILOSEC) 20 MG capsule, Take 20 mg by mouth daily., Disp: , Rfl:  .  tamsulosin (FLOMAX) 0.4 MG CAPS capsule, Take 2 capsules (0.8 mg total) by mouth daily. (Patient taking differently: Take 0.4 mg by mouth 2 (two) times daily. ), Disp: 180 capsule, Rfl: 3 .  Tiotropium Bromide Monohydrate (SPIRIVA HANDIHALER IN), Inhale 1 capsule into the lungs daily. , Disp: , Rfl:   Assessment/ Plan: 73 y.o. male   1. Diarrhea, unspecified type Uncertain etiology.  He denies any infectious etiologies or symptoms.  Because it is only been going on a couple of days and is isolated to only 1 time per day, I feel that the risk of dehydration at this point is low.  I encouraged him to push oral fluids.  Okay to use Imodium only as directed on the package and only for the next couple of days.  If symptoms worsen or he develops any other worrisome symptoms or signs including abdominal pain, fever, vomiting or inability to stay hydrated he is to seek immediate medical attention.  He voiced good understanding and will follow-up PRN.   Start time: 10:46am End time: 10:52am  No orders of the defined types were placed in this encounter.   Raliegh Ip, DO [facility] (  [phone]   

## 2019-01-12 IMAGING — DX DG ABDOMEN 2V
4 series · 4 of 4 positions shown · non-contrast
Comparison: June 05, 2007

CLINICAL DATA: Abdominal pain with vomiting

EXAM:
ABDOMEN - 2 VIEW

[abdomen erect (1 of 2)]
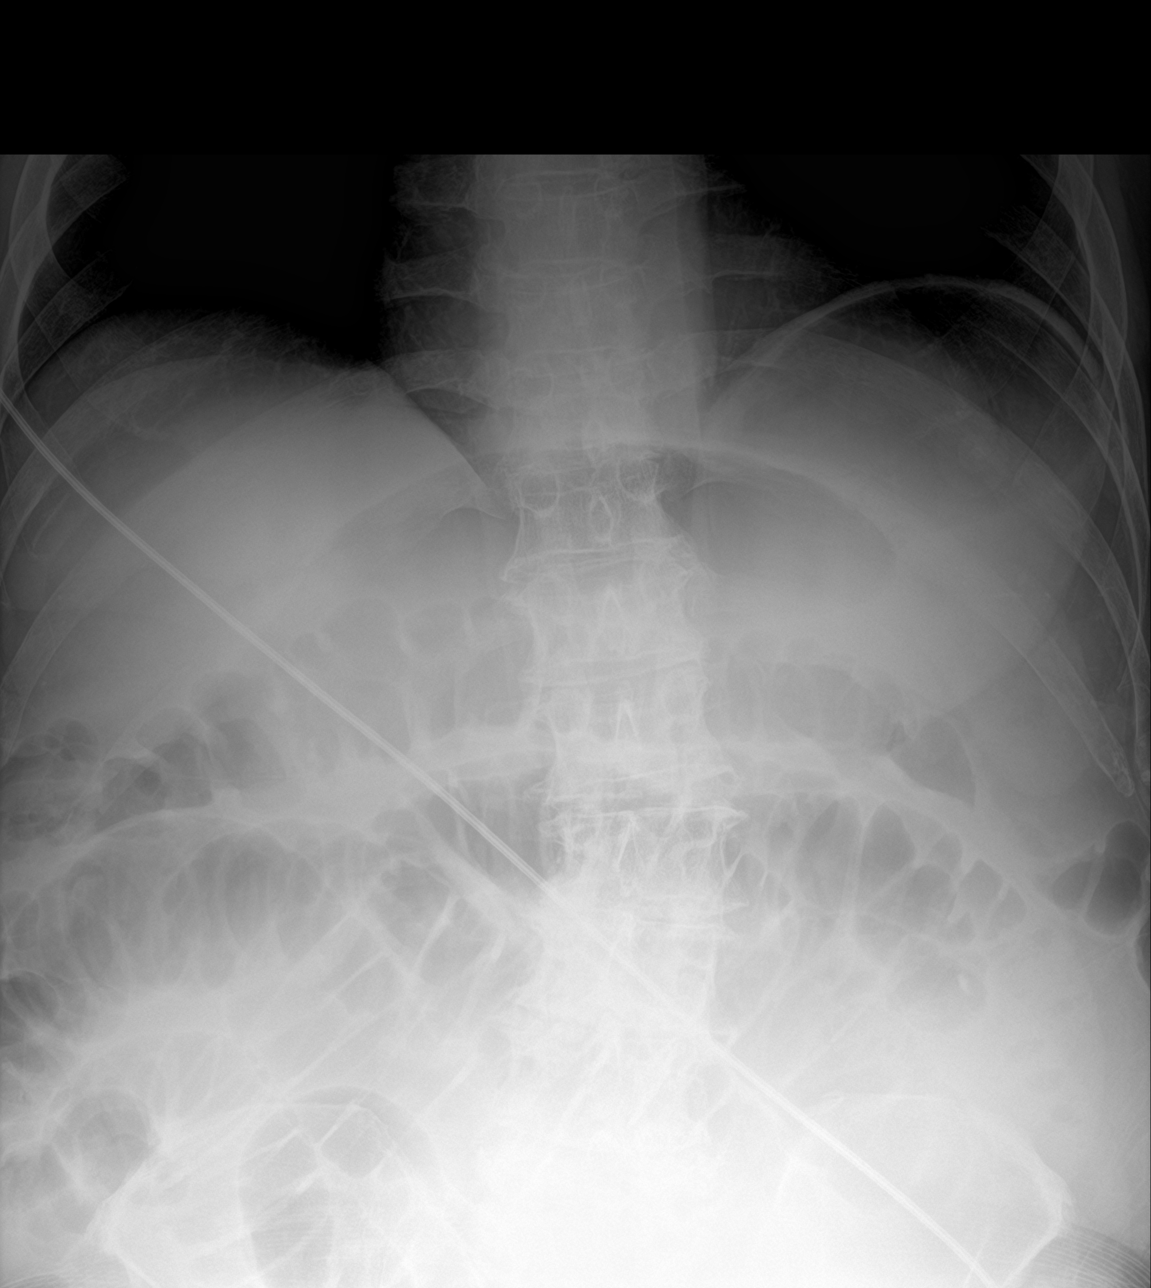

[abdomen supine (1 of 2)]
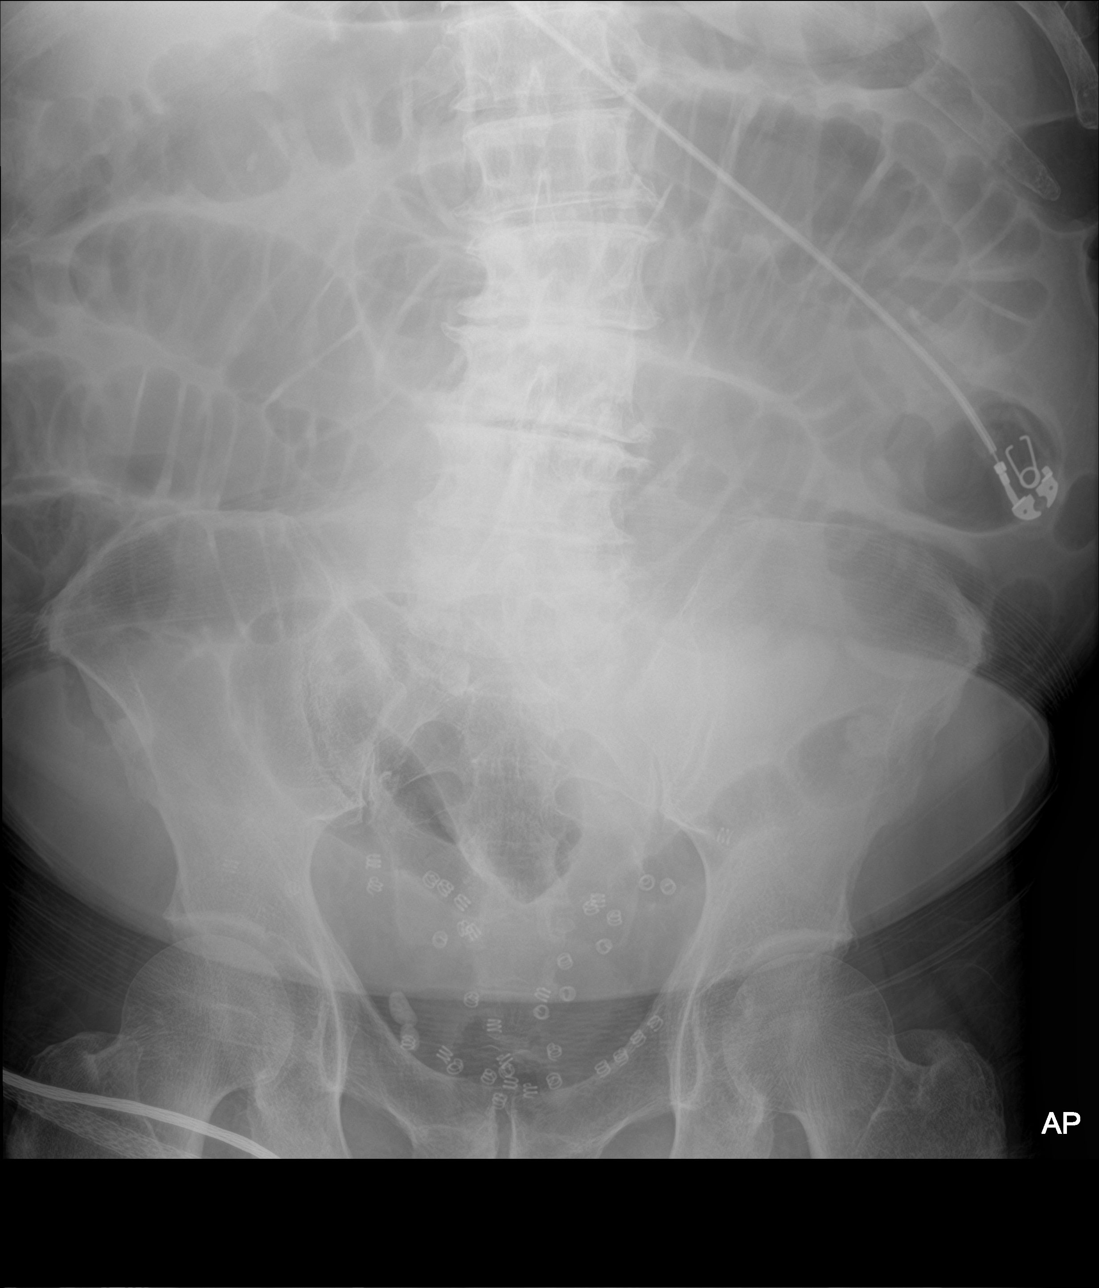

[abdomen supine (2 of 2)]
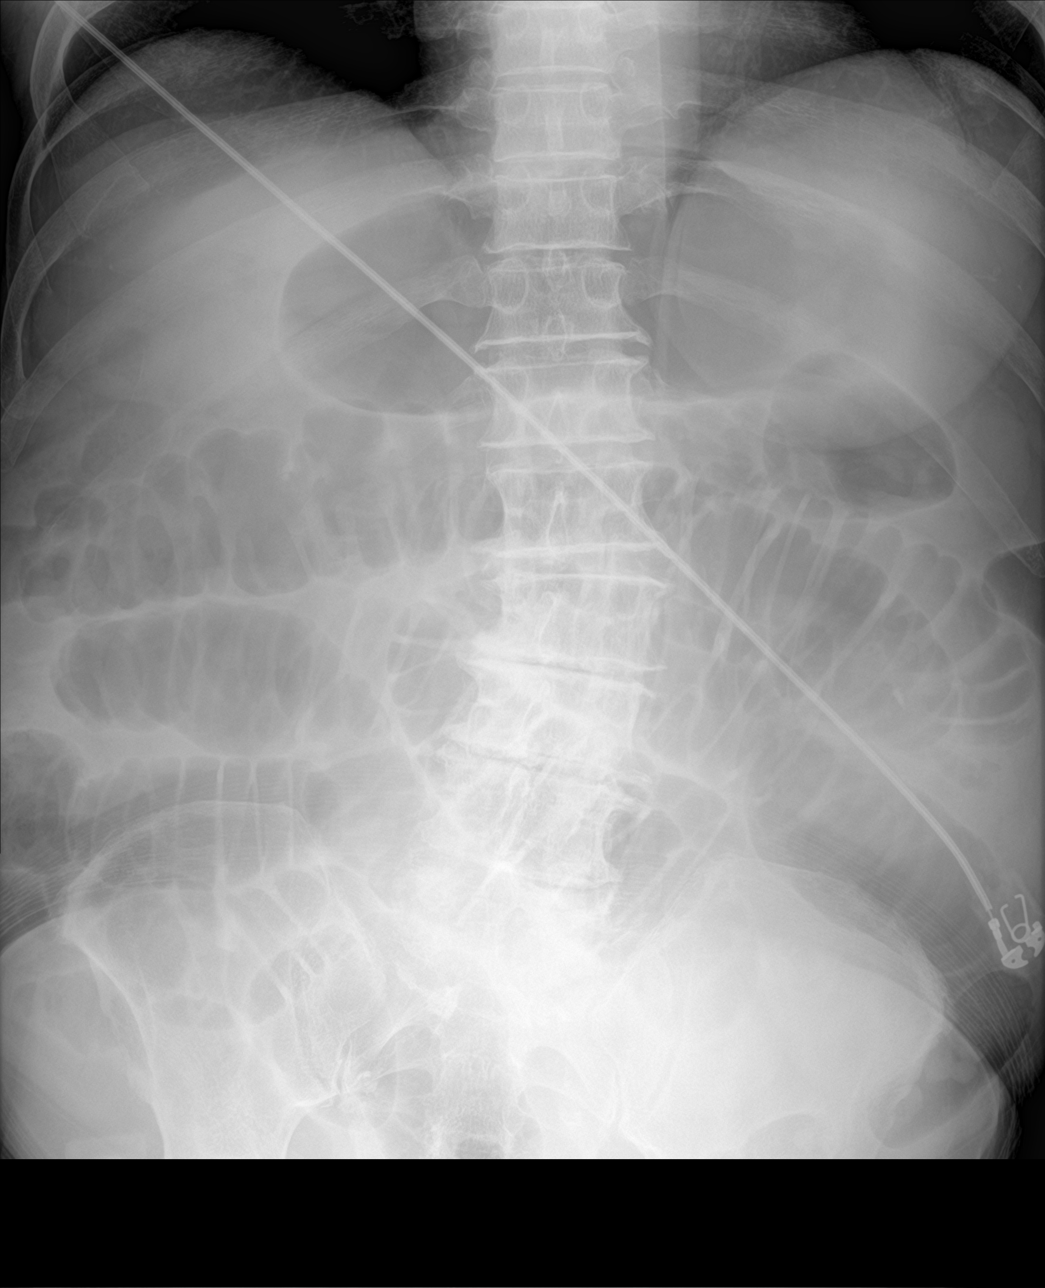

[abdomen erect (2 of 2)]
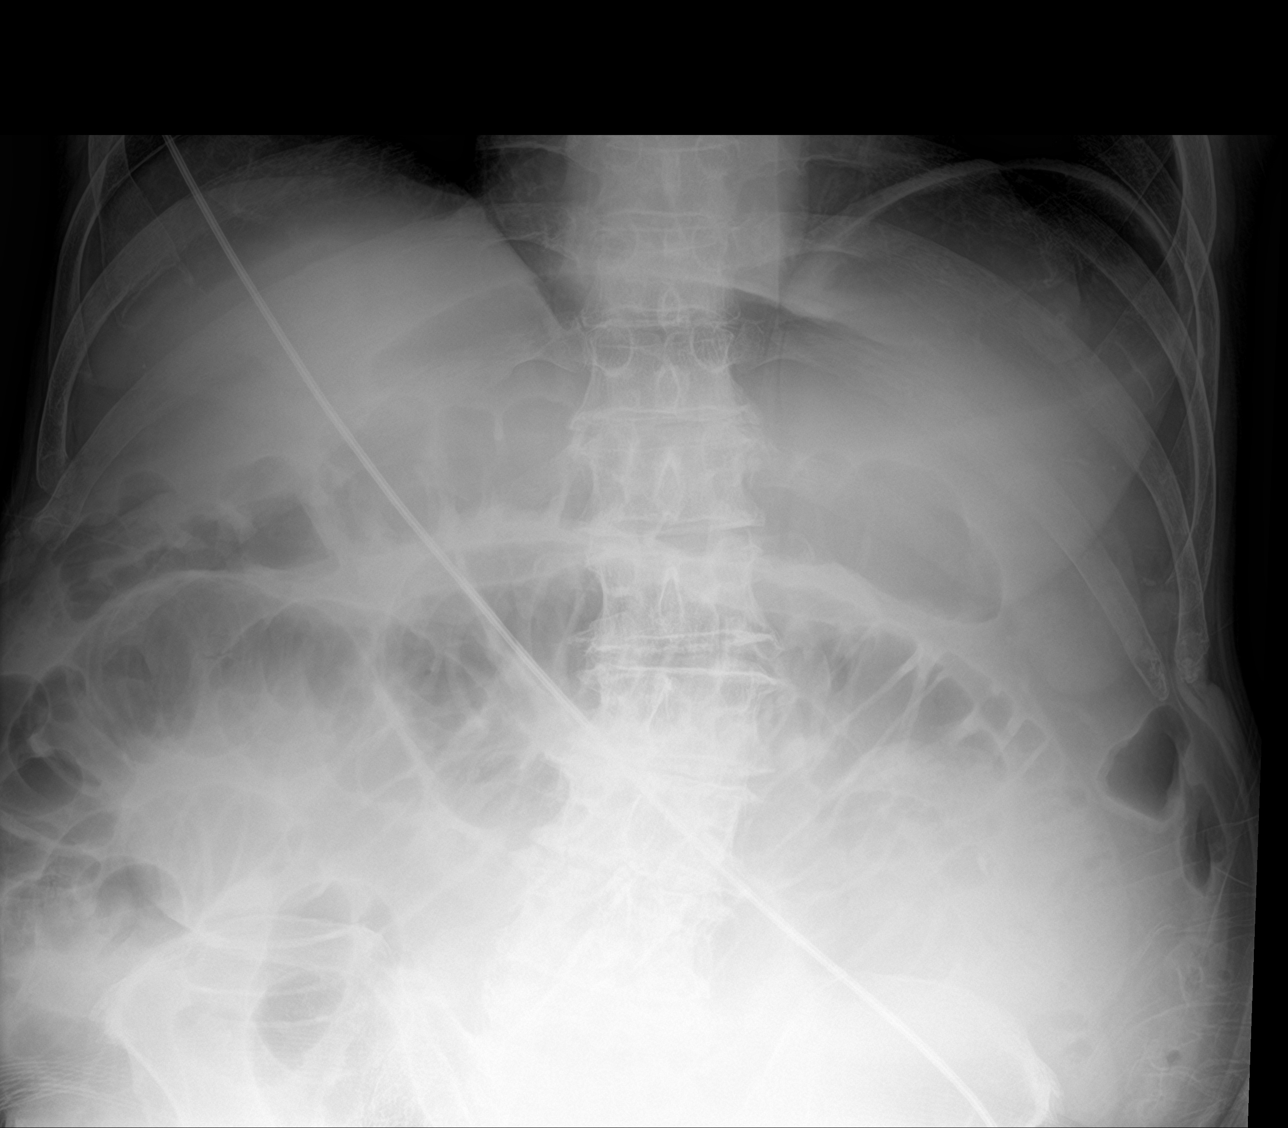

[4 of 4 positions shown; findings below may reference images not displayed]

FINDINGS: Supine and upright images were obtained. There are multiple loops of
dilated small bowel. There are no appreciable air-fluid levels. No
free air evident. Postoperative changes noted in the pelvis. Lung
bases are clear.
IMPRESSION: Multiple loops of dilated small bowel. This appearance is concerning
for a degree of bowel obstruction. No evident free air.
Postoperative change noted in the pelvis.

## 2019-01-12 IMAGING — CT CT ABD-PELV W/ CM
2 of 5 series · 15 of 46 positions shown, 17 images · IV contrast (APPLIED)
Comparison: Abdominal radiographs March 12, 2017

CLINICAL DATA: Bowel distention with nausea

EXAM:
CT ABDOMEN AND PELVIS WITH CONTRAST
TECHNIQUE: Multidetector CT imaging of the abdomen and pelvis was performed
using the standard protocol following bolus administration of
intravenous contrast.
CONTRAST:  75mL POLQ0E-QCC IOPAMIDOL (POLQ0E-QCC) INJECTION 61%

[Series 3: abdomen 5.0 · axial · 0.93mm/px · z∈[+737,+1152]mm · 12 of 95 slices shown, 14 images]
[im 6/95  soft-tissue]
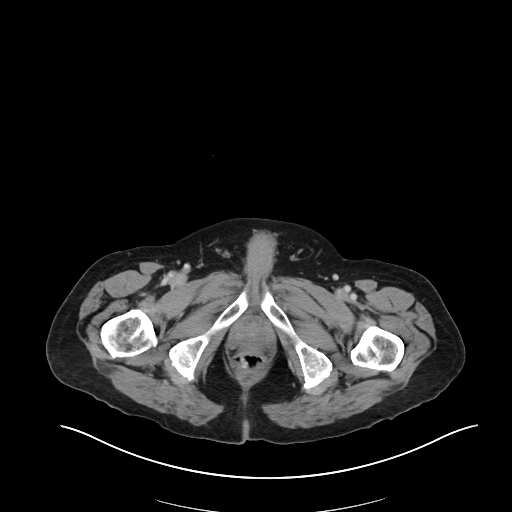
[im 6/95  bone]
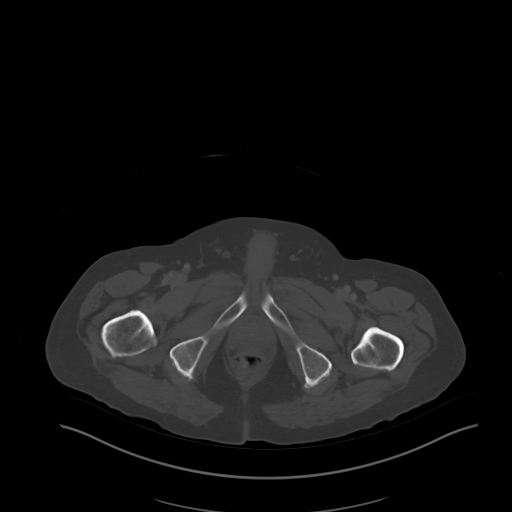
[im 12/95  soft-tissue]
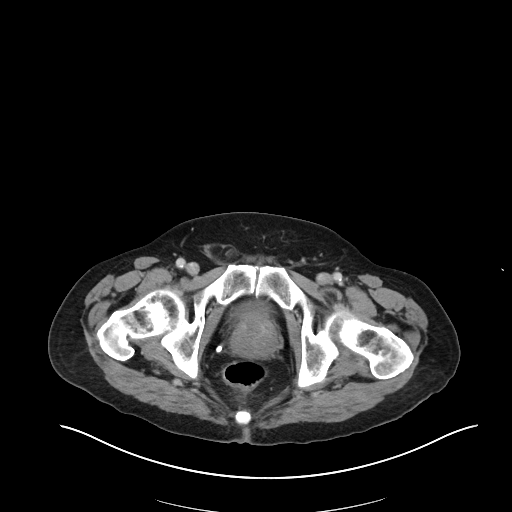
[im 24/95  soft-tissue]
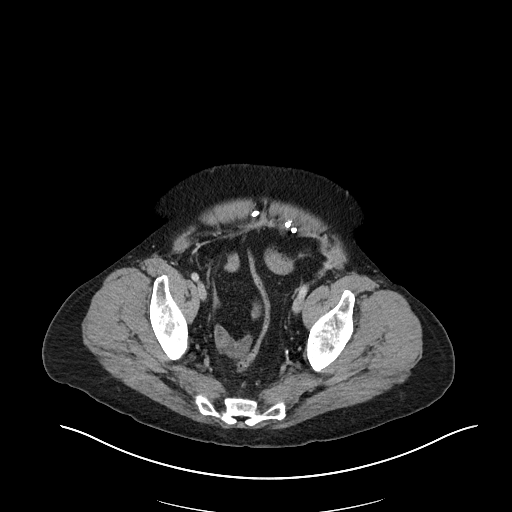
[im 30/95  soft-tissue]
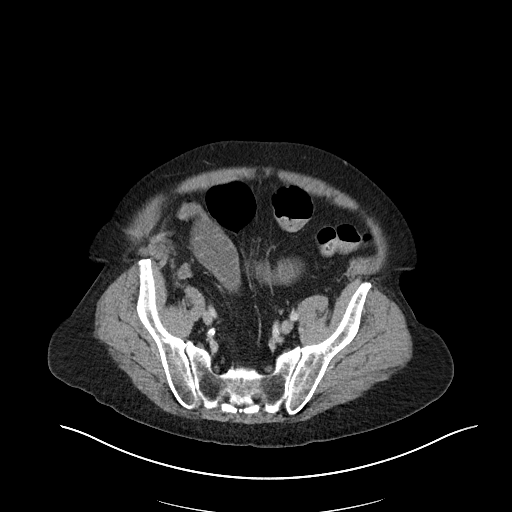
[im 36/95  soft-tissue]
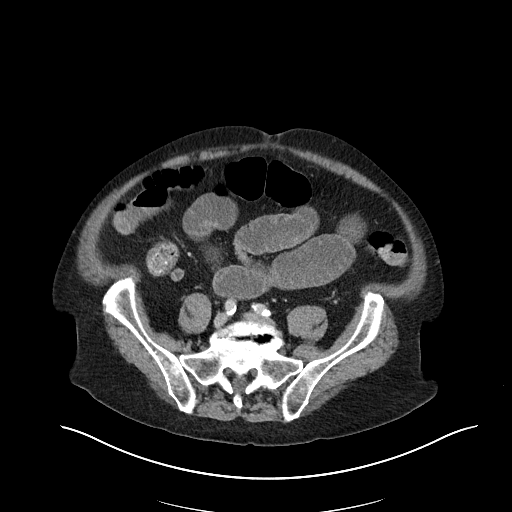
[im 42/95  soft-tissue]
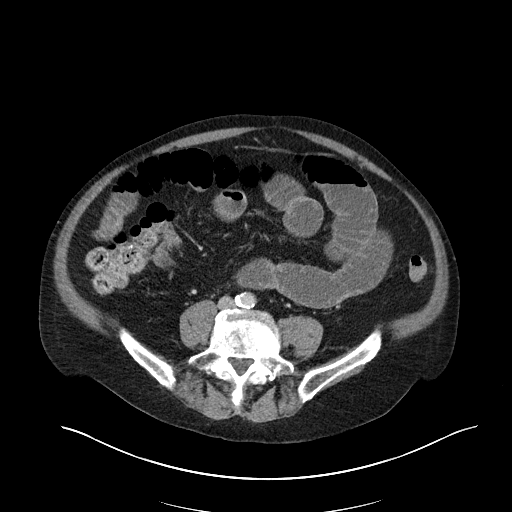
[im 53/95  soft-tissue]
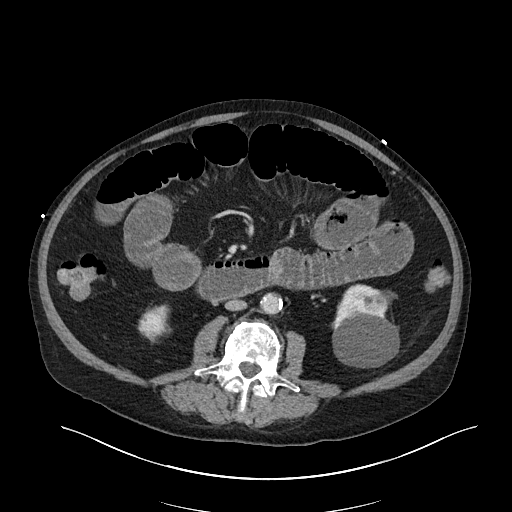
[im 59/95  soft-tissue]
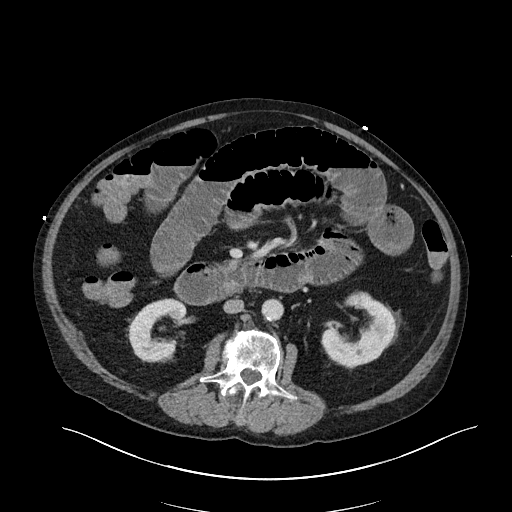
[im 65/95  soft-tissue]
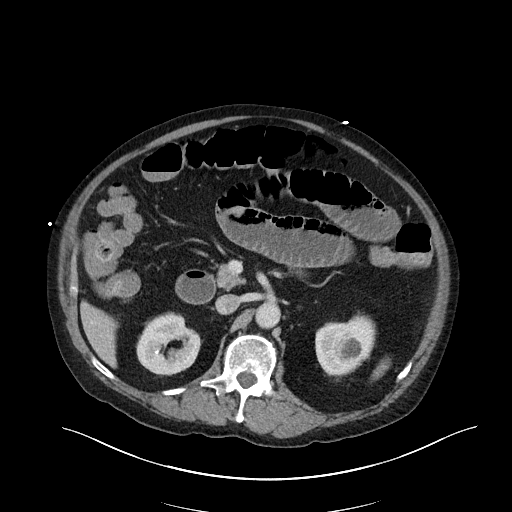
[im 65/95  bone]
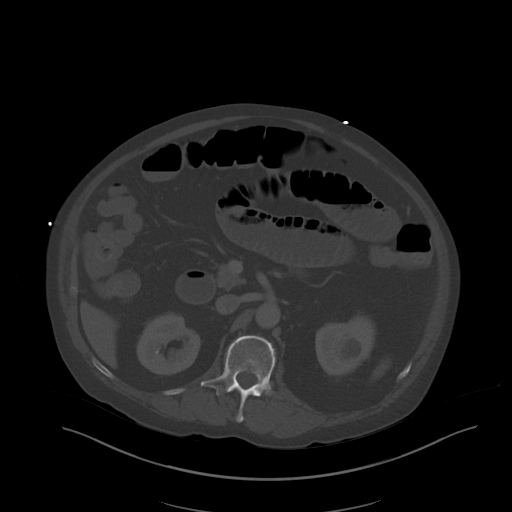
[im 71/95  soft-tissue]
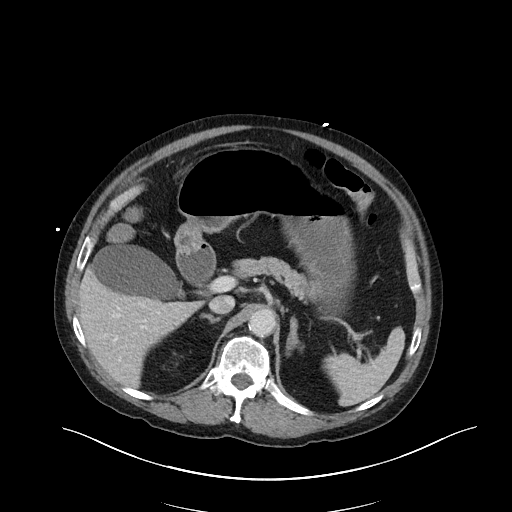
[im 83/95  soft-tissue]
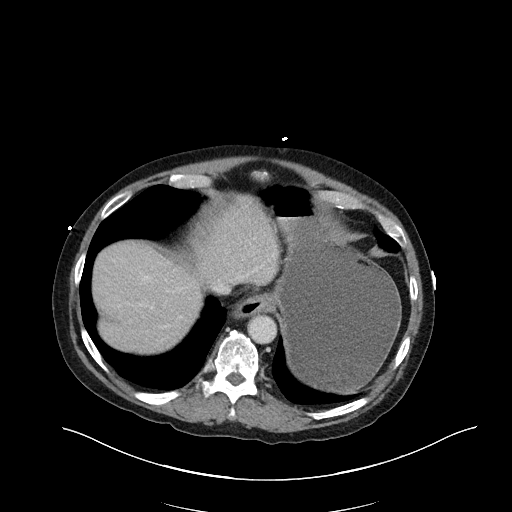
[im 89/95  soft-tissue]
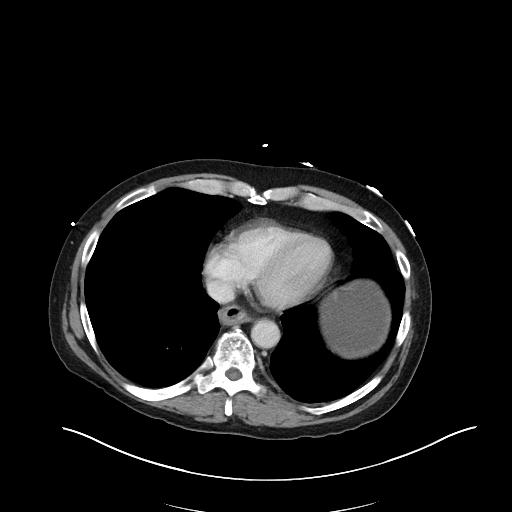

[Series 6: abdomen 3.0 mpr cor · coronal · 0.88mm/px · 3 of 106 slices shown]
[im 36/106  soft-tissue]
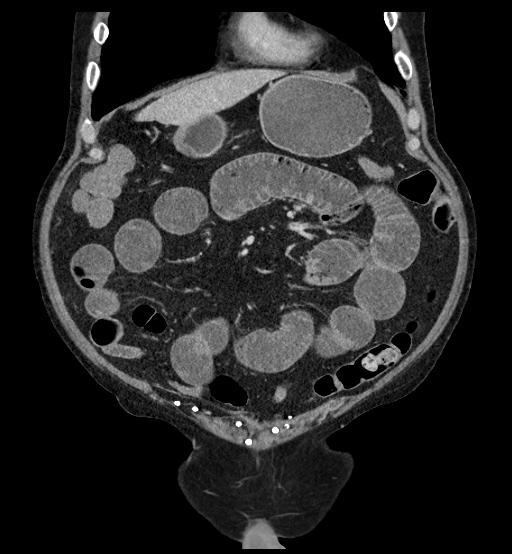
[im 47/106  soft-tissue]
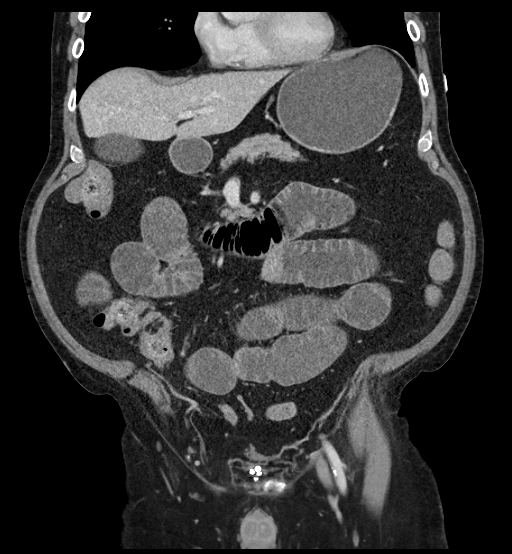
[im 59/106  soft-tissue]
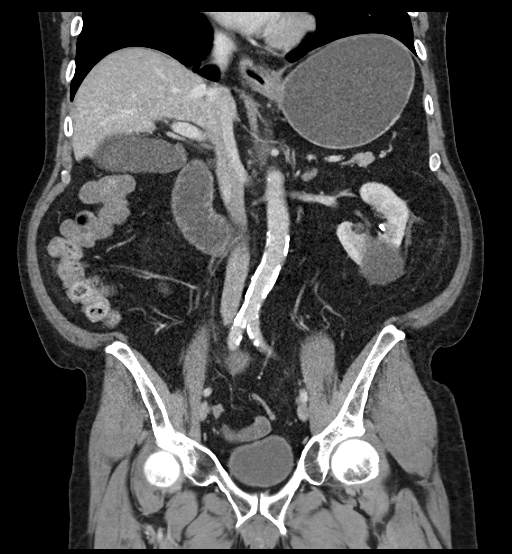

[15 of 46 positions shown; findings below may reference images not displayed]

FINDINGS: Lower chest: There is mild bibasilar atelectasis. There is no lung
base edema or consolidation. There is a small hiatal hernia.

Hepatobiliary: No focal liver lesions are appreciable. Gallbladder
wall is not appreciably thickened. There is no biliary duct
dilatation.

Pancreas: No pancreatic mass or inflammatory focus.

Spleen: No splenic lesions are evident.

Adrenals/Urinary Tract: Adrenals appear normal bilaterally. There
are multiple cysts arising in the left kidney. The largest cyst
arises from the lower pole region measuring 6.4 x 5.3 cm. There is
no appreciable hydronephrosis on either side. Within the mid right
kidney, there is a calculus measuring 8 x 4 mm. There is a calculus
in the lower pole left kidney measuring 6 x 5 mm. There is no
appreciable ureteral calculus on either side. Urinary bladder is
midline with wall thickness within normal limits.

Stomach/Bowel: Stomach is distended with fluid and air. There is
dilatation of most small bowel loops. There is a transition zone in
the mid ileal region in the right anterior pelvis indicative of
small bowel obstruction.

Vascular/Lymphatic: There is atherosclerotic calcification in the
aorta and common iliac arteries. Major mesenteric vessels appear
patent. No adenopathy is appreciable in the abdomen or pelvis.

Reproductive: Prostate and seminal vesicles appear within normal
limits in size and contour. The attenuation of the prostate is
somewhat inhomogeneous. A small calculus is noted in the prostate.
No well-defined pelvic mass is evident.

Other: There is postoperative change with mesh in the lower pelvis.
There is fat in the right inguinal ring. There is a minimal ventral
hernia containing only fat. Appendix is not appreciable. There is no
periappendiceal region inflammation. There is no abscess or ascites
in the abdomen or pelvis.

Musculoskeletal: There is multifocal degenerative change in the
lumbar spine. There are no blastic or lytic bone lesions. There is
no intramuscular or abdominal wall lesion.
IMPRESSION: 1. Small bowel obstruction with transition zone in the mid ileal
region in the anterior right pelvis. No evident free air.

2.  No abscess.  No periappendiceal region inflammation evident.

3. Postoperative change anterior left pelvis. There is an inguinal
hernia containing only fat. There is a minimal ventral hernia
containing only fat. There is a small hiatal hernia.

4. Nonobstructing calculi in each kidney. No hydronephrosis or
ureteral calculus on either side.

5. Somewhat inhomogeneous appearing prostate. This finding may
warrant PSA to further assess.

6.  Aortoiliac atherosclerosis.

Aortic Atherosclerosis (HXNEQ-0TW.W).

## 2021-04-26 ENCOUNTER — Emergency Department (HOSPITAL_COMMUNITY): Payer: No Typology Code available for payment source

## 2021-04-26 ENCOUNTER — Observation Stay (HOSPITAL_COMMUNITY)
Admission: EM | Admit: 2021-04-26 | Discharge: 2021-04-28 | Disposition: A | Discharge status: Home / Self Care | Payer: No Typology Code available for payment source | Attending: Internal Medicine | Admitting: Internal Medicine

## 2021-04-26 ENCOUNTER — Other Ambulatory Visit: Payer: Self-pay

## 2021-04-26 ENCOUNTER — Encounter (HOSPITAL_COMMUNITY): Payer: Self-pay | Admitting: Emergency Medicine

## 2021-04-26 ENCOUNTER — Observation Stay (HOSPITAL_COMMUNITY): Payer: No Typology Code available for payment source

## 2021-04-26 DIAGNOSIS — Z7951 Long term (current) use of inhaled steroids: Secondary | ICD-10-CM | POA: Insufficient documentation

## 2021-04-26 DIAGNOSIS — Z87891 Personal history of nicotine dependence: Secondary | ICD-10-CM | POA: Diagnosis not present

## 2021-04-26 DIAGNOSIS — E872 Acidosis, unspecified: Secondary | ICD-10-CM | POA: Insufficient documentation

## 2021-04-26 DIAGNOSIS — J189 Pneumonia, unspecified organism: Secondary | ICD-10-CM | POA: Diagnosis not present

## 2021-04-26 DIAGNOSIS — Z79899 Other long term (current) drug therapy: Secondary | ICD-10-CM | POA: Insufficient documentation

## 2021-04-26 DIAGNOSIS — I10 Essential (primary) hypertension: Secondary | ICD-10-CM | POA: Insufficient documentation

## 2021-04-26 DIAGNOSIS — J441 Chronic obstructive pulmonary disease with (acute) exacerbation: Secondary | ICD-10-CM | POA: Diagnosis not present

## 2021-04-26 DIAGNOSIS — Z20822 Contact with and (suspected) exposure to covid-19: Secondary | ICD-10-CM | POA: Insufficient documentation

## 2021-04-26 DIAGNOSIS — R0602 Shortness of breath: Secondary | ICD-10-CM | POA: Diagnosis present

## 2021-04-26 LAB — URINALYSIS, ROUTINE W REFLEX MICROSCOPIC
Bilirubin Urine: NEGATIVE
Glucose, UA: NEGATIVE mg/dL
Ketones, ur: NEGATIVE mg/dL
Leukocytes,Ua: NEGATIVE
Nitrite: NEGATIVE
Protein, ur: NEGATIVE mg/dL
Specific Gravity, Urine: 1.015 (ref 1.005–1.030)
pH: 6 (ref 5.0–8.0)

## 2021-04-26 LAB — CBC WITH DIFFERENTIAL/PLATELET
Abs Immature Granulocytes: 0.04 10*3/uL (ref 0.00–0.07)
Basophils Absolute: 0 10*3/uL (ref 0.0–0.1)
Basophils Relative: 0 %
Eosinophils Absolute: 0 10*3/uL (ref 0.0–0.5)
Eosinophils Relative: 0 %
HCT: 45.9 % (ref 39.0–52.0)
Hemoglobin: 15.7 g/dL (ref 13.0–17.0)
Immature Granulocytes: 0 %
Lymphocytes Relative: 6 %
Lymphs Abs: 0.8 10*3/uL (ref 0.7–4.0)
MCH: 30.8 pg (ref 26.0–34.0)
MCHC: 34.2 g/dL (ref 30.0–36.0)
MCV: 90.2 fL (ref 80.0–100.0)
Monocytes Absolute: 1.2 10*3/uL — ABNORMAL HIGH (ref 0.1–1.0)
Monocytes Relative: 9 %
Neutro Abs: 10.8 10*3/uL — ABNORMAL HIGH (ref 1.7–7.7)
Neutrophils Relative %: 85 %
Platelets: 248 10*3/uL (ref 150–400)
RBC: 5.09 MIL/uL (ref 4.22–5.81)
RDW: 13.6 % (ref 11.5–15.5)
WBC: 12.8 10*3/uL — ABNORMAL HIGH (ref 4.0–10.5)
nRBC: 0 % (ref 0.0–0.2)

## 2021-04-26 LAB — URINALYSIS, MICROSCOPIC (REFLEX)

## 2021-04-26 LAB — COMPREHENSIVE METABOLIC PANEL
ALT: 12 U/L (ref 0–44)
AST: 14 U/L — ABNORMAL LOW (ref 15–41)
Albumin: 3.4 g/dL — ABNORMAL LOW (ref 3.5–5.0)
Alkaline Phosphatase: 72 U/L (ref 38–126)
Anion gap: 11 (ref 5–15)
BUN: 19 mg/dL (ref 8–23)
CO2: 23 mmol/L (ref 22–32)
Calcium: 9 mg/dL (ref 8.9–10.3)
Chloride: 106 mmol/L (ref 98–111)
Creatinine, Ser: 0.78 mg/dL (ref 0.61–1.24)
GFR, Estimated: >60 mL/min (ref >60)
Glucose, Bld: 113 mg/dL — ABNORMAL HIGH (ref 70–99)
Potassium: 4 mmol/L (ref 3.5–5.1)
Sodium: 140 mmol/L (ref 135–145)
Total Bilirubin: 0.6 mg/dL (ref 0.3–1.2)
Total Protein: 6.7 g/dL (ref 6.5–8.1)

## 2021-04-26 LAB — APTT: aPTT: 24 seconds (ref 24–36)

## 2021-04-26 LAB — LACTIC ACID, PLASMA
Lactic Acid, Venous: 2.2 mmol/L (ref 0.5–1.9)
Lactic Acid, Venous: 3.4 mmol/L (ref 0.5–1.9)
Lactic Acid, Venous: 3 mmol/L (ref 0.5–1.9)
Lactic Acid, Venous: 1.7 mmol/L (ref 0.5–1.9)

## 2021-04-26 LAB — D-DIMER, QUANTITATIVE: D-Dimer, Quant: 0.5 ug/mL-FEU (ref 0.00–0.50)

## 2021-04-26 LAB — RESP PANEL BY RT-PCR (FLU A&B, COVID) ARPGX2
Influenza A by PCR: NEGATIVE
Influenza B by PCR: NEGATIVE
SARS Coronavirus 2 by RT PCR: NEGATIVE

## 2021-04-26 LAB — PROTIME-INR
INR: 1 (ref 0.8–1.2)
Prothrombin Time: 12.6 seconds (ref 11.4–15.2)

## 2021-04-26 MED ORDER — ENOXAPARIN SODIUM 40 MG/0.4ML IJ SOSY
40.0000 mg | PREFILLED_SYRINGE | INTRAMUSCULAR | Status: DC
  Administered 2021-04-26 – 2021-04-27 (×2): 40 mg via SUBCUTANEOUS
  Filled 2021-04-26 (×2): qty 0.4

## 2021-04-26 MED ORDER — MAGNESIUM SULFATE 2 GM/50ML IV SOLN
2.0000 g | Freq: Once | INTRAVENOUS | Status: AC
  Administered 2021-04-26: 2 g via INTRAVENOUS
  Filled 2021-04-26: qty 50

## 2021-04-26 MED ORDER — FLUTICASONE FUROATE-VILANTEROL 200-25 MCG/ACT IN AEPB
1.0000 | INHALATION_SPRAY | Freq: Every day | RESPIRATORY_TRACT | Status: DC
  Administered 2021-04-28: 1 via RESPIRATORY_TRACT
  Filled 2021-04-26 (×2): qty 28

## 2021-04-26 MED ORDER — SODIUM CHLORIDE 0.9 % IV SOLN
1.0000 g | Freq: Once | INTRAVENOUS | Status: AC
  Administered 2021-04-26: 1 g via INTRAVENOUS
  Filled 2021-04-26: qty 10

## 2021-04-26 MED ORDER — DOXYCYCLINE HYCLATE 100 MG PO TABS
100.0000 mg | ORAL_TABLET | Freq: Once | ORAL | Status: AC
  Administered 2021-04-26: 100 mg via ORAL
  Filled 2021-04-26: qty 1

## 2021-04-26 MED ORDER — ALBUTEROL SULFATE (2.5 MG/3ML) 0.083% IN NEBU
2.5000 mg | INHALATION_SOLUTION | Freq: Once | RESPIRATORY_TRACT | Status: AC
Start: 2021-04-26 — End: 2021-04-26
  Administered 2021-04-26: 2.5 mg via RESPIRATORY_TRACT
  Filled 2021-04-26: qty 3

## 2021-04-26 MED ORDER — LACTATED RINGERS IV SOLN: INTRAVENOUS | Status: AC

## 2021-04-26 MED ORDER — LORAZEPAM 2 MG/ML IJ SOLN
0.5000 mg | Freq: Once | INTRAMUSCULAR | Status: AC
  Administered 2021-04-26: 0.5 mg via INTRAVENOUS
  Filled 2021-04-26: qty 1

## 2021-04-26 MED ORDER — ALBUTEROL SULFATE (2.5 MG/3ML) 0.083% IN NEBU
20.0000 mg/h | INHALATION_SOLUTION | Freq: Once | RESPIRATORY_TRACT | Status: AC
  Administered 2021-04-26: 20 mg/h via RESPIRATORY_TRACT
  Filled 2021-04-26: qty 24

## 2021-04-26 MED ORDER — IPRATROPIUM BROMIDE 0.02 % IN SOLN
0.5000 mg | Freq: Once | RESPIRATORY_TRACT | Status: AC
  Administered 2021-04-26: 0.5 mg via RESPIRATORY_TRACT
  Filled 2021-04-26: qty 2.5

## 2021-04-26 MED ORDER — SODIUM CHLORIDE 0.9 % IV SOLN: 1.0000 g | Freq: Every day | INTRAVENOUS | Status: DC

## 2021-04-26 MED ORDER — IPRATROPIUM-ALBUTEROL 0.5-2.5 (3) MG/3ML IN SOLN
3.0000 mL | Freq: Once | RESPIRATORY_TRACT | Status: AC
  Administered 2021-04-26: 3 mL via RESPIRATORY_TRACT
  Filled 2021-04-26: qty 3

## 2021-04-26 MED ORDER — METHYLPREDNISOLONE SODIUM SUCC 125 MG IJ SOLR
125.0000 mg | INTRAMUSCULAR | Status: AC
  Administered 2021-04-26: 125 mg via INTRAVENOUS
  Filled 2021-04-26: qty 2

## 2021-04-26 MED ORDER — PHENOL 1.4 % MT LIQD
1.0000 | OROMUCOSAL | Status: DC | PRN
  Administered 2021-04-26: 1 via OROMUCOSAL
  Filled 2021-04-26: qty 177

## 2021-04-26 MED ORDER — IOHEXOL 350 MG/ML SOLN
65.0000 mL | Freq: Once | INTRAVENOUS | Status: AC | PRN
  Administered 2021-04-26: 65 mL via INTRAVENOUS

## 2021-04-26 MED ORDER — LACTATED RINGERS IV BOLUS
1355.0000 mL | Freq: Once | INTRAVENOUS | Status: AC
  Administered 2021-04-26: 1355 mL via INTRAVENOUS

## 2021-04-26 MED ORDER — LACTATED RINGERS IV BOLUS
1000.0000 mL | Freq: Once | INTRAVENOUS | Status: AC
Start: 2021-04-26 — End: 2021-04-26
  Administered 2021-04-26: 1000 mL via INTRAVENOUS

## 2021-04-26 MED ORDER — NICOTINE 21 MG/24HR TD PT24
21.0000 mg | MEDICATED_PATCH | Freq: Once | TRANSDERMAL | Status: AC
  Administered 2021-04-26: 21 mg via TRANSDERMAL
  Filled 2021-04-26: qty 1

## 2021-04-26 MED ORDER — AMLODIPINE BESYLATE 5 MG PO TABS
5.0000 mg | ORAL_TABLET | Freq: Every day | ORAL | Status: DC
  Administered 2021-04-26 – 2021-04-28 (×3): 5 mg via ORAL
  Filled 2021-04-26 (×3): qty 1

## 2021-04-26 MED ORDER — GABAPENTIN 300 MG PO CAPS
300.0000 mg | ORAL_CAPSULE | Freq: Every day | ORAL | Status: DC
  Administered 2021-04-26 – 2021-04-27 (×2): 300 mg via ORAL
  Filled 2021-04-26 (×2): qty 1

## 2021-04-26 MED ORDER — IPRATROPIUM-ALBUTEROL 0.5-2.5 (3) MG/3ML IN SOLN
3.0000 mL | Freq: Once | RESPIRATORY_TRACT | Status: DC
  Filled 2021-04-26: qty 3

## 2021-04-26 MED ORDER — IPRATROPIUM-ALBUTEROL 0.5-2.5 (3) MG/3ML IN SOLN
3.0000 mL | RESPIRATORY_TRACT | Status: DC | PRN
  Administered 2021-04-28: 3 mL via RESPIRATORY_TRACT
  Filled 2021-04-26: qty 3

## 2021-04-26 MED ORDER — PREDNISONE 20 MG PO TABS
40.0000 mg | ORAL_TABLET | Freq: Every day | ORAL | Status: DC
  Administered 2021-04-27 – 2021-04-28 (×2): 40 mg via ORAL
  Filled 2021-04-26 (×2): qty 2

## 2021-04-26 MED ORDER — DOXYCYCLINE HYCLATE 100 MG PO TABS
100.0000 mg | ORAL_TABLET | Freq: Two times a day (BID) | ORAL | Status: DC
  Administered 2021-04-26 – 2021-04-28 (×4): 100 mg via ORAL
  Filled 2021-04-26 (×5): qty 1

## 2021-04-26 MED ORDER — SODIUM CHLORIDE 0.9 % IV BOLUS: 1000.0000 mL | Freq: Once | INTRAVENOUS | Status: DC

## 2021-04-26 MED ORDER — ALBUTEROL SULFATE (2.5 MG/3ML) 0.083% IN NEBU
2.5000 mg | INHALATION_SOLUTION | Freq: Once | RESPIRATORY_TRACT | Status: DC
  Filled 2021-04-26: qty 3

## 2021-04-26 MED ORDER — TAMSULOSIN HCL 0.4 MG PO CAPS
0.8000 mg | ORAL_CAPSULE | Freq: Every day | ORAL | Status: DC
  Administered 2021-04-26 – 2021-04-28 (×3): 0.8 mg via ORAL
  Filled 2021-04-26 (×3): qty 2

## 2021-04-26 MED ORDER — PANTOPRAZOLE SODIUM 40 MG PO TBEC
40.0000 mg | DELAYED_RELEASE_TABLET | Freq: Every day | ORAL | Status: DC
  Administered 2021-04-26 – 2021-04-28 (×3): 40 mg via ORAL
  Filled 2021-04-26 (×3): qty 1

## 2021-04-26 MED ORDER — UMECLIDINIUM-VILANTEROL 62.5-25 MCG/ACT IN AEPB: 1.0000 | INHALATION_SPRAY | Freq: Every day | RESPIRATORY_TRACT | Status: DC

## 2021-04-26 NOTE — Hospital Course (Addendum)
Acute COPD exacerbation likely secondary to underlying CAP -Patient presented to ED with worsening shortness of breath over the last 3 to 4 days as well as increased purulence of his sputum with no relief from his inhalers.  In the ED he was noted to have leukocytosis up to 12, increased lactic acidosis in the threes as well as increased work of breathing and shortness of breath requiring O2 supplementation.  Patient also noted to have right middle lobe infiltrate on his chest x-ray as well as scattered groundglass opacities possibly infectious/inflammatory on the CT chest.  I suspect that he has an acute on chronic COPD exacerbation secondary to an underlying pneumonia. -Patient leukocytosis and lactic acidosis have now resolved -Initially on ceftriaxone and doxy for 1 day, and then transitioned to oral cefdinir and doxycycline to complete a 5-day course - PT/OT evaluation  -We will continue prednisone 40 mg daily to complete a 5-day course -Continue DuoNebs as needed as well as Breo Ellipta  Lactic acidosis, resolved  Likely from albuterol use. Pt is doing well and BP is stable.    HTN Well controlled during admission on home med -Continue amlodipine 5 mg daily   GERD -Continue Protonix 40 mg p.o. daily   BPH -Continue tamsulosin 0.8 mg p.o. daily   Tobacco use -Nicotine patch

## 2021-04-26 NOTE — ED Provider Triage Note (Signed)
Emergency Medicine Provider Triage Evaluation Note  Brent Good. , a 76 y.o. male  was evaluated in triage.  Pt complains of shortness of breath.  Patient states he has a history of COPD.  States that for the past 2 days he has had increasing shortness of breath as well as a productive cough with yellow phlegm.  No rhinorrhea or sore throat.  No other complaints.  Physical Exam  There were no vitals taken for this visit. Gen:   Awake, no distress   Resp:  Normal effort  MSK:   Moves extremities without difficulty  Other:    Medical Decision Making  Medically screening exam initiated at 6:51 AM.  Appropriate orders placed.  Brent Good. was informed that the remainder of the evaluation will be completed by another provider, this initial triage assessment does not replace that evaluation, and the importance of remaining in the ED until their evaluation is complete.   [name], PA-C 04/26/21 [phone]

## 2021-04-26 NOTE — ED Triage Notes (Signed)
Pt reports he is unable to walk 15 feet before he becomes SOB.  Reports he has been coughing up yellow "stuff".  Pt smokes 1/2 pack daily.  "As long as I sit still I'm alright."

## 2021-04-26 NOTE — Sepsis Progress Note (Signed)
Notified provider of need to order third lactic acid.  °

## 2021-04-26 NOTE — Sepsis Progress Note (Signed)
Sepsis protocol monitored by eLink 

## 2021-04-26 NOTE — ED Notes (Signed)
Daughter, Gwinda Maine, requests pt call [phone]. Thank you.

## 2021-04-26 NOTE — ED Notes (Signed)
Daughter Mamie, updated on Pt condition

## 2021-04-26 NOTE — ED Notes (Signed)
MD contacted for throat spray and something for cough

## 2021-04-26 NOTE — ED Provider Notes (Signed)
[facility] EMERGENCY DEPARTMENT Provider Note   CSN: [id number] Arrival date & time: 04/26/21  7829     History  Chief Complaint  Patient presents with   Shortness of Breath    Brent Good. is a 76 y.o. male.   Shortness of Breath Pt is a 76 year old w pmhx of copd, htn   Presents today w 3 days of worsening SOB w cough and productive of mucousy sputum. No fevers. States it's been dark yellow. Chronic cough and smoker. States chest pressure but no chest pain just feels very SOB worse w exertion.   No fevers at home. Using inhalers without relief.      Home Medications Prior to Admission medications   Medication Sig Start Date End Date Taking? Authorizing Provider  albuterol (PROVENTIL HFA;VENTOLIN HFA) 108 (90 Base) MCG/ACT inhaler Inhale 2 puffs into the lungs every 6 (six) hours as needed for wheezing or shortness of breath.   Yes [provider]  amLODipine (NORVASC) 5 MG tablet Take 5 mg by mouth daily.   Yes [provider]  budesonide-formoterol (SYMBICORT) 160-4.5 MCG/ACT inhaler Inhale 2 puffs into the lungs 2 (two) times daily. 12/02/12  Yes Ileana Ladd, MD  Camphor-Eucalyptus-Menthol (VAPORIZING CHEST RUB EX) Apply 1 application topically daily as needed (congestion).   Yes [provider]  gabapentin (NEURONTIN) 300 MG capsule Take 300 mg by mouth at bedtime.   Yes [provider]  ipratropium (ATROVENT) 0.02 % nebulizer solution Inhale 2.5 mLs (0.5 mg total) into the lungs 2 (two) times daily. 03/16/17  Yes Regalado, Belkys A, MD  naproxen (NAPROSYN) 500 MG tablet Take 500 mg by mouth 2 (two) times daily with a meal.   Yes [provider]  omeprazole (PRILOSEC) 20 MG capsule Take 20 mg by mouth daily.   Yes [provider]  sodium chloride (OCEAN) 0.65 % SOLN nasal spray Place 1 spray into both nostrils daily as needed for congestion.   Yes [provider]  tamsulosin (FLOMAX)  0.4 MG CAPS capsule Take 2 capsules (0.8 mg total) by mouth daily. Patient taking differently: Take 0.4 mg by mouth 2 (two) times daily. 11/20/13  Yes Ozella Rocks, MD  Tiotropium Bromide Monohydrate (SPIRIVA HANDIHALER IN) Inhale 1 capsule into the lungs daily.    Yes [provider]      Allergies    Azithromycin    Review of Systems   Review of Systems  Respiratory:  Positive for shortness of breath.    Physical Exam Updated Vital Signs BP 136/68    Pulse (!) 134    Temp 98.3 F (36.8 C) (Oral)    Resp (!) 27    Ht 5\' 8"  (1.727 m)    Wt 78.5 kg    SpO2 94%    BMI 26.30 kg/m  Physical Exam Vitals and nursing note reviewed.  Constitutional:      General: He is in acute distress.     Comments: Tachypnea, uncomfortable. Unwell appearing. Is tripod position and is unable to speak in full sentences without taking breaths every 4-5 words.   HENT:     Head: Normocephalic and atraumatic.     Nose: Nose normal.  Eyes:     General: No scleral icterus. Cardiovascular:     Rate and Rhythm: Normal rate and regular rhythm.     Pulses: Normal pulses.     Heart sounds: Normal heart sounds.  Pulmonary:     Effort:  Tachypnea and respiratory distress present.     Breath sounds: Decreased breath sounds and wheezing present.  Abdominal:     Palpations: Abdomen is soft.     Tenderness: There is no abdominal tenderness.  Musculoskeletal:     Cervical back: Normal range of motion.     Right lower leg: No edema.     Left lower leg: No edema.  Skin:    General: Skin is warm and dry.     Capillary Refill: Capillary refill takes less than 2 seconds.  Neurological:     Mental Status: He is alert. Mental status is at baseline.  Psychiatric:        Mood and Affect: Mood normal.        Behavior: Behavior normal.    ED Results / Procedures / Treatments   Labs (all labs ordered are listed, but only abnormal results are displayed) Labs Reviewed  COMPREHENSIVE METABOLIC PANEL -  Abnormal; Notable for the following components:      Result Value   Glucose, Bld 113 (*)    Albumin 3.4 (*)    AST 14 (*)    All other components within normal limits  CBC WITH DIFFERENTIAL/PLATELET - Abnormal; Notable for the following components:   WBC 12.8 (*)    Neutro Abs 10.8 (*)    Monocytes Absolute 1.2 (*)    All other components within normal limits  LACTIC ACID, PLASMA - Abnormal; Notable for the following components:   Lactic Acid, Venous 2.2 (*)    All other components within normal limits  LACTIC ACID, PLASMA - Abnormal; Notable for the following components:   Lactic Acid, Venous 3.4 (*)    All other components within normal limits  URINALYSIS, ROUTINE W REFLEX MICROSCOPIC - Abnormal; Notable for the following components:   Hgb urine dipstick SMALL (*)    All other components within normal limits  URINALYSIS, MICROSCOPIC (REFLEX) - Abnormal; Notable for the following components:   Bacteria, UA RARE (*)    All other components within normal limits  RESP PANEL BY RT-PCR (FLU A&B, COVID) ARPGX2  CULTURE, BLOOD (ROUTINE X 2)  CULTURE, BLOOD (ROUTINE X 2)  PROTIME-INR  APTT  D-DIMER, QUANTITATIVE  LACTIC ACID, PLASMA    EKG EKG Interpretation  Date/Time:  Tuesday April 26 2021 06:48:26 EST Ventricular Rate:  114 PR Interval:  134 QRS Duration: 74 QT Interval:  308 QTC Calculation: 424 R Axis:   91 Text Interpretation: Sinus tachycardia Right atrial enlargement Rightward axis Borderline ECG When compared with ECG of 12-Mar-2017 12:31, PREVIOUS ECG IS PRESENT Confirmed by Blane OharaZavitz, Joshua [id number]) on 04/26/2021 10:51:25 AM  Radiology DG Chest 2 View  Result Date: 04/26/2021 CLINICAL DATA:  76 year old male with shortness of breath. COPD exacerbation. EXAM: CHEST - 2 VIEW COMPARISON:  Chest radiographs 05/07/2017 and earlier. FINDINGS: Chronic large lung volumes. Mediastinal contours remain normal. Visualized tracheal air column is within normal limits. No  pneumothorax or pulmonary edema. No pleural effusion or consolidation. Mildly increased streaky opacity in the right lower lung, appears related to the middle lobe on the lateral view. No acute osseous abnormality identified. Negative visible bowel gas. IMPRESSION: Chronic hyperinflation with mildly increased streaky right middle lobe opacity, compatible with acute infectious exacerbation. No pleural effusion. Electronically Signed   By: Odessa FlemingH  Hall M.D.   On: 04/26/2021 07:18    Procedures .Critical Care Performed by: Gailen ShelterFondaw, Leah Skora S, PA Authorized by: Gailen ShelterFondaw, Leroy Pettway S, PA   Critical care provider statement:  Critical care time (minutes):  35   Critical care time was exclusive of:  Separately billable procedures and treating other patients and teaching time   Critical care was necessary to treat or prevent imminent or life-threatening deterioration of the following conditions:  Respiratory failure   Critical care was time spent personally by me on the following activities:  Development of treatment plan with patient or surrogate, review of old charts, re-evaluation of patient's condition, pulse oximetry, ordering and review of radiographic studies, ordering and review of laboratory studies, ordering and performing treatments and interventions, obtaining history from patient or surrogate, examination of patient and evaluation of patient's response to treatment   Care discussed with: admitting provider      Medications Ordered in ED Medications  lactated ringers infusion ( Intravenous New Bag/Given 04/26/21 1138)  nicotine (NICODERM CQ - dosed in mg/24 hours) patch 21 mg (21 mg Transdermal Patch Applied 04/26/21 1455)  umeclidinium-vilanterol (ANORO ELLIPTA) 62.5-25 MCG/ACT 1 puff (has no administration in time range)  enoxaparin (LOVENOX) injection 30 mg (has no administration in time range)  cefTRIAXone (ROCEPHIN) 1 g in sodium chloride 0.9 % 100 mL IVPB (has no administration in time range)   doxycycline (VIBRA-TABS) tablet 100 mg (has no administration in time range)  predniSONE (DELTASONE) tablet 40 mg (has no administration in time range)  ipratropium-albuterol (DUONEB) 0.5-2.5 (3) MG/3ML nebulizer solution 3 mL (has no administration in time range)  ipratropium-albuterol (DUONEB) 0.5-2.5 (3) MG/3ML nebulizer solution 3 mL (has no administration in time range)  cefTRIAXone (ROCEPHIN) 1 g in sodium chloride 0.9 % 100 mL IVPB (0 g Intravenous Stopped 04/26/21 1211)  doxycycline (VIBRA-TABS) tablet 100 mg (100 mg Oral Given 04/26/21 1140)  lactated ringers bolus 1,000 mL (0 mLs Intravenous Stopped 04/26/21 1221)  methylPREDNISolone sodium succinate (SOLU-MEDROL) 125 mg/2 mL injection 125 mg (125 mg Intravenous Given 04/26/21 1141)  ipratropium-albuterol (DUONEB) 0.5-2.5 (3) MG/3ML nebulizer solution 3 mL (3 mLs Nebulization Given 04/26/21 1140)  albuterol (PROVENTIL) (2.5 MG/3ML) 0.083% nebulizer solution 2.5 mg (2.5 mg Nebulization Given 04/26/21 1140)  LORazepam (ATIVAN) injection 0.5 mg (0.5 mg Intravenous Given 04/26/21 1221)  lactated ringers bolus 1,355 mL (0 mLs Intravenous Stopped 04/26/21 1355)  magnesium sulfate IVPB 2 g 50 mL (0 g Intravenous Stopped 04/26/21 1356)  albuterol (PROVENTIL) (2.5 MG/3ML) 0.083% nebulizer solution (20 mg/hr Nebulization Given 04/26/21 1301)  ipratropium (ATROVENT) nebulizer solution 0.5 mg (0.5 mg Nebulization Given 04/26/21 1301)    ED Course/ Medical Decision Making/ A&P Clinical Course as of 04/26/21 1634  Tue Apr 26, 2021  1131 Patient reevaluated.  Discussed with RT will initiate BiPAP.  Solu-Medrol albuterol/ipratropium 5/5 Consider mag we will hold off on this time. [WF]  1234 Re-eval -patient with more audible wheezing now.  Seems to be breathing more easily.  Remains tachycardic.  Lactic elevated at 2.2 sepsis protocol/code sepsis was initiated on patient contact will provide the additional 1.3 L of fluid resuscitation.  Empiric antibiotic  were administered already. [WF]    Clinical Course User Index [WF] Gailen Shelter, PA                           Medical Decision Making Amount and/or Complexity of Data Reviewed Labs: ordered. Radiology: ordered.  Risk OTC drugs. Prescription drug management. Decision regarding hospitalization.  This patient presents to the ED for concern of shortness of breath, this involves a number of treatment options, and is a complaint that carries with  it a high risk of complications and morbidity.  The differential diagnosis includes The causes for shortness of breath include but are not limited to Cardiac (AHF, pericardial effusion and tamponade, arrhythmias, ischemia, etc) Respiratory (COPD, asthma, pneumonia, pneumothorax, primary pulmonary hypertension, PE/VQ mismatch) Hematological (anemia) Neuromuscular (ALS, Guillain-Barr, etc)    Co morbidities: Discussed in HPI   Brief History:  Pt is a 76 year old w pmhx of copd, htn   Presents today w 3 days of worsening SOB w cough and productive of mucousy sputum. No fevers. States it's been dark yellow. Chronic cough and smoker. States chest pressure but no chest pain just feels very SOB worse w exertion.   No fevers at home. Using inhalers without relief.   EMR reviewed including pt PMHx, past surgical history and past visits to ER.   See HPI for more details   Lab Tests:  I ordered and independently interpreted labs.  The pertinent results include:    Labs notable for leukocytosis w left shift. Lactic 2.2 >> 3.4 fluids given abx initiated and 3rd lactic ordered. UA unremarkable. Cultures were drawn prior to abx. CAP abx. Sepsis protocol utilized.   Imaging Studies:  Abnormal findings. I personally reviewed all imaging studies. Imaging notable for  RML pneumonia - PE study pending at time of admission.   Cardiac Monitoring:  The patient was maintained on a cardiac monitor.  I personally viewed and interpreted the  cardiac monitored which showed an underlying rhythm of: sinus tachycardia EKG non-ischemic   Medicines ordered:  I ordered medication including albuterol/magnesium, Solu-Medrol, empiric antibiotics including Rocephin and doxycycline, 30 mL/kg fluid resuscitation.  For pneumonia, tachycardia, COPD, wheezing Reevaluation of the patient after these medicines showed that the patient improved but not resolved.  Patient is still quite critically ill. I have reviewed the patients home medicines and have made adjustments as needed   Critical Interventions:  Treatments as above.  Admission to the hospital   Consults:  I requested consultation with hospitalist,  and discussed lab and imaging findings as well as pertinent plan - they recommend: Admission    Reevaluation:  After the interventions noted above I re-evaluated patient and found that they have :stayed the same   Social Determinants of Health:  The patient's social determinants of health were a factor in the care of this patient    Problem List / ED Course:  Patient is severely ill with respiratory failure secondary to COPD and potentially from pneumonia. Patient declined BiPAP I think that he would ultimately benefit from this.  He has not been hypoxic but is quite tachypneic and does use accessory muscle to breathe.   I discussed this case with my attending physician who cosigned this note including patient's presenting symptoms, physical exam, and planned diagnostics and interventions. Attending physician stated agreement with plan or made changes to plan which were implemented.   Attending physician assessed patient at bedside.    Dispostion:  After consideration of the diagnostic results and the patients response to treatment, I feel that the patent would benefit from admission     Patient mated to hospitalist service internal medicine teaching service.  CT PE study pending at time of admission.   Final  Clinical Impression(s) / ED Diagnoses Final diagnoses:  COPD exacerbation (HCC)  Community acquired pneumonia of right middle lobe of lung    Rx / DC Orders ED Discharge Orders     None         Blanchie DessertFondaw, Rodrigo RanWylder S,  PA 04/26/21 1658    Blane Ohara, MD 04/28/21 1557

## 2021-04-26 NOTE — H&P (Signed)
Date: 04/26/2021               Patient Name:  Brent Good. MRN: [id number]  DOB: 12-30-1945 Age / Sex: 76 y.o., male   PCP: [facility]         Medical Service: Internal Medicine Teaching Service         Attending Physician: Dr. Aldine Contes, MD    First Contact: Dr. Lorin Glass Pager: [phone]  Second Contact: Dr. Eulas Post Pager: [phone]       After Hours (After 5p/  First Contact Pager: [phone]  weekends / holidays): Second Contact Pager: [phone]   Chief Complaint: SOB  History of Present Illness: Brent Good. is a 76 y.o. male with a PMHx of COPD, HTN, and GERD who presents to [facility] today with a chief complaint of SOB.   The patient states that his symptoms started Sunday night when he noticed worsening SOB and a change in color of his sputum.  His sputum is normally white, but this time it was yellow-orangish in color. He could also walk only about 10-15 feet without having significant SOB.  Denies chest pain but states that he feels like he "loses all his air" when he ambulates.  States that he has to stand with his head between his legs until he can catch his breath again.  Denies fevers, however endorses episode of when his cheeks got red and his face felt warm a couple days ago.  States that the cough he has now is no worse than it has been.  At home, he has tried using more of his inhalers to alleviate the shortness of breath, however this did not help.  States that he had the flu in the fall, but he has not been sick or been around sick contacts since then.  He is not on any oxygen at home, but he is on 3 inhalers for his COPD at home.  He has been using these as prescribed without any recent changes in his medication regimen.  Including this hospitalization, he has been hospitalized for COPD a total of 2 times.  No diarrhea or constipation. No abdominal pain.  No LE edema.  Did have an episode Sunday night of feeling of "needles in his legs," but he has not  had this happen since then.  No other complaints or concerns today.  Meds:  Current Meds  Medication Sig   albuterol (PROVENTIL HFA;VENTOLIN HFA) 108 (90 Base) MCG/ACT inhaler Inhale 2 puffs into the lungs every 6 (six) hours as needed for wheezing or shortness of breath.   amLODipine (NORVASC) 5 MG tablet Take 5 mg by mouth daily.   budesonide-formoterol (SYMBICORT) 160-4.5 MCG/ACT inhaler Inhale 2 puffs into the lungs 2 (two) times daily.   Camphor-Eucalyptus-Menthol (VAPORIZING CHEST RUB EX) Apply 1 application topically daily as needed (congestion).   gabapentin (NEURONTIN) 300 MG capsule Take 300 mg by mouth at bedtime.   ipratropium (ATROVENT) 0.02 % nebulizer solution Inhale 2.5 mLs (0.5 mg total) into the lungs 2 (two) times daily.   naproxen (NAPROSYN) 500 MG tablet Take 500 mg by mouth 2 (two) times daily with a meal.   omeprazole (PRILOSEC) 20 MG capsule Take 20 mg by mouth daily.   sodium chloride (OCEAN) 0.65 % SOLN nasal spray Place 1 spray into both nostrils daily as needed for congestion.   tamsulosin (FLOMAX) 0.4 MG CAPS capsule Take 2 capsules (0.8 mg total) by mouth daily. (Patient taking  differently: Take 0.4 mg by mouth 2 (two) times daily.)   Tiotropium Bromide Monohydrate (SPIRIVA HANDIHALER IN) Inhale 1 capsule into the lungs daily.     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  He used to smoke a pack a day, however  about 8-10 years ago he cut back to half a pack a day.  Does not drink alcohol currently, but states that for period of 55 years he used to "drink like a fish."  States that he would drink about half case of 12 pack of beer per night.  He quit drinking about 12 years ago.  No other drug use.  Review of Systems: A complete ROS was negative except as per HPI.   Physical Exam: Blood pressure 136/68, pulse (!) 134, temperature 98.3 F (36.8 C), temperature source Oral, resp. rate (!) 27, height 5\' 8"  (1.727 m), weight 78.5 kg, SpO2 94 %. General: NAD, increased work of breathing on 2-3L, however otherwise normal appearance HE: Normocephalic, atraumatic, EOMI, Conjunctivae normal ENT: No congestion, no rhinorrhea, no exudate or erythema  Cardiovascular: Tachycardic, regular rhythm. No murmurs, rubs, or gallops Pulmonary: Increased WOB on 2-3L, rales in right lower lung base, scattered end-expiratory wheezing.  Abdominal: soft, nontender, bowel sounds present Musculoskeletal: Trace edema in BLE, no deformity, injury, or tenderness in extremities Skin: Warm, dry, no bruising, erythema, or rash Psychiatric/Behavioral: normal mood, normal behavior    EKG: personally reviewed my interpretation is sinus tachycardia, unchanged from prior  CXR: personally reviewed my interpretation is chronic hyperinflation with mildly increased streaky right middle lobe opacity, compatible with acute infectious exacerbation. No pleural effusions noted.  CT angio chest for PE:  1. Negative for acute PE or thoracic aortic dissection. 2. Coronary and Aortic Atherosclerosis. 3. Small scattered bilateral ground-glass opacities, nonspecific, presumably infectious/inflammatory. Non-contrast chest CT at 3-6 months is recommended. If nodules persist, subsequent management will be based upon the most suspicious nodule(s).    Assessment & Plan by Problem: Principal Problem:   COPD with acute exacerbation ([place])  COPD  exacerbation CAP Patient is presenting with 2/3 cardinal symptoms of COPD exacerbation (increased dyspnea, increased sputum purulence). The patient has been hospitalized one other time for a COPD exacerbation. He has not on oxygen at home, but he takes 3 inhalers which have not alleviated his sx recently. In the ED, patient was tachycardic in the 110s-120s, and he was satting 93% on room air. Exam notable for increased work of breathing, decreased breath sounds, rales in right lower lung field, and wheezing. Chest x-ray shows streaky right middle lobe opacity compatible with acute infectious exacerbation. He was placed on 2-3 L HFNC and was given albuterol, DuoNebs, Atrovent, mag sulfate, and Solu-Medrol, which the patient states has alleviated his symptoms. Also started on broad-spectrum antibiotics due to high suspicion for CAP.   -Continue HFNC at 2-3 L, wean as tolerated -Continue IV ceftriaxone and p.o. doxycycline -DuoNebs every 3 hours as needed -Start  prednisone 40 mg p.o. daily -Daily Breo Ellipta  Lactic acidosis, likely type B On admit, labs notable for leukocytosis with left shift.  His lactic acid levels on admit were 2.2. Code sepsis was called, and patient was given a total of 2.3 L of LR and is now on LR infusion.  Repeat lactic acid of 3.4 after fluids.  Blood cultures show no growth so far.  Less likely type A lactic acidosis given that the patient is mentating well, normal blood pressures, and is perfusing well on exam.  Could be a type B lactic acidosis albuterol inhaler use.  Other causes such as DM and alcohol use are less likely as pt does not currently drink alcohol or have a h/o DM. -Trend lactic acid levels  HTN BP has mainly been ranging in the normotensive to slightly hypertensive range while the patient has been here. -Continue amlodipine 5 mg daily  GERD -Continue Protonix 40 mg p.o. daily  BPH -Continue tamsulosin 0.8 mg p.o. daily  Tobacco use -Nicotine  patch  Dispo: Admit patient to Observation with expected length of stay less than 2 midnights.  Signed: Orvis Brill, MD 04/26/2021, 4:10 PM  Pager: [phone]  After 5pm on weekdays and 1pm on weekends: On Call pager: [phone]

## 2021-04-26 NOTE — Progress Notes (Signed)
RT came by to place pt on BIPAP but pt does not want to place BIPAP on at this time. PA was notified.

## 2021-04-26 NOTE — Progress Notes (Addendum)
° °  Subjective: No acute overnight events.   Patient was seen at bedside during rounds today. Pt reports feeling better compared to when he arrived to the ED. He is able to ambulate without significant SHOB, and reports improvement in sputum production as well.   Pt is updated on the plan for today, and all questions and concerns are addressed.   Objective:  Vital signs in last 24 hours: Vitals:   04/27/21 0300 04/27/21 0400 04/27/21 0500 04/27/21 0600  BP: 135/79 (!) 126/98 125/79 124/69  Pulse: 80 93 76 86  Resp: 18 19 16 20   Temp:   98.4 F (36.9 C)   TempSrc:   Oral   SpO2: 97% 96% 97% 96%  Weight:      Height:        Constitutional: alert, well-appearing, in NAD Cardiovascular: RRR, no m/r/g, 1+ bilateral LE edema  Pulm: normal WOB on  2-3L, bilateral scattered end-expiratory wheezing. Abdominal: soft, non-tender to palpation, non-distended MSK: normal bulk and tone Neurological: A&O x 3 Skin: warm and dry Psych: normal behavior, normal affect   Assessment/Plan:  Principal Problem:   COPD with acute exacerbation (HCC)  Brent Good. is a 76 y.o. male with a PMHx of COPD, HTN, and GERD admitted for COPD exacerbation in setting of CAP.   Acute COPD exacerbation likely 2/2 CAP Improvement in [facility] and sputum production with breathing tx and abx. Leukocytosis and lactic acidosis resolved. O2 sat 97% on 3L [place]. Has bilateral expiratory wheezing on exam. Transitioned to Cefdinir and doxy to complete 5 day course, and continue prednisone for total of 5 days.   - Cefdinir and doxy  - Prednisone  - Wean pt off of O2 as tolerated  - Continue DuoNebs as needed as well as Breo Ellipta.    Lactic acidosis, resolved  Likely from albuterol use. Pt is doing well and BP is stable.    HTN Well controlled during admission on home med -Continue amlodipine 5 mg daily   GERD -Continue Protonix 40 mg p.o. daily   BPH -Continue tamsulosin 0.8 mg p.o. daily   Tobacco  use -Nicotine patch  Best Practice: Diet: Heart Healthy IVF: Lr 150 cc/hr  VTE: Enoxaparin Code: Full   [facility], MD  Internal Medicine Resident, PGY-1 Pager: [phone] After 5pm on weekdays and 1pm on weekends: On Call pager [phone]

## 2021-04-27 ENCOUNTER — Encounter (HOSPITAL_COMMUNITY): Payer: Self-pay | Admitting: Internal Medicine

## 2021-04-27 DIAGNOSIS — I10 Essential (primary) hypertension: Secondary | ICD-10-CM | POA: Diagnosis not present

## 2021-04-27 DIAGNOSIS — N400 Enlarged prostate without lower urinary tract symptoms: Secondary | ICD-10-CM

## 2021-04-27 DIAGNOSIS — J441 Chronic obstructive pulmonary disease with (acute) exacerbation: Principal | ICD-10-CM

## 2021-04-27 DIAGNOSIS — K219 Gastro-esophageal reflux disease without esophagitis: Secondary | ICD-10-CM

## 2021-04-27 DIAGNOSIS — Z720 Tobacco use: Secondary | ICD-10-CM

## 2021-04-27 LAB — BASIC METABOLIC PANEL
Anion gap: 6 (ref 5–15)
BUN: 20 mg/dL (ref 8–23)
CO2: 26 mmol/L (ref 22–32)
Calcium: 8.2 mg/dL — ABNORMAL LOW (ref 8.9–10.3)
Chloride: 108 mmol/L (ref 98–111)
Creatinine, Ser: 0.92 mg/dL (ref 0.61–1.24)
GFR, Estimated: >60 mL/min (ref >60)
Glucose, Bld: 119 mg/dL — ABNORMAL HIGH (ref 70–99)
Potassium: 4.3 mmol/L (ref 3.5–5.1)
Sodium: 140 mmol/L (ref 135–145)

## 2021-04-27 LAB — CBC
HCT: 40.1 % (ref 39.0–52.0)
Hemoglobin: 13.7 g/dL (ref 13.0–17.0)
MCH: 31.2 pg (ref 26.0–34.0)
MCHC: 34.2 g/dL (ref 30.0–36.0)
MCV: 91.3 fL (ref 80.0–100.0)
Platelets: 214 10*3/uL (ref 150–400)
RBC: 4.39 MIL/uL (ref 4.22–5.81)
RDW: 13.8 % (ref 11.5–15.5)
WBC: 9.2 10*3/uL (ref 4.0–10.5)
nRBC: 0 % (ref 0.0–0.2)

## 2021-04-27 MED ORDER — CEFDINIR 300 MG PO CAPS
300.0000 mg | ORAL_CAPSULE | Freq: Two times a day (BID) | ORAL | Status: DC
  Administered 2021-04-27 – 2021-04-28 (×3): 300 mg via ORAL
  Filled 2021-04-27 (×5): qty 1

## 2021-04-27 MED ORDER — SALINE SPRAY 0.65 % NA SOLN
2.0000 | NASAL | Status: DC | PRN
  Filled 2021-04-27: qty 44

## 2021-04-27 NOTE — ED Notes (Signed)
[phone], called patient daughter for update, no answer. Unable to leave a message.

## 2021-04-27 NOTE — Progress Notes (Incomplete)
° °  Subjective: No acute overnight events.   Patient was seen at bedside during rounds today. Pt reports feeling better compared to when he arrived to the ED. He is able to ambulate without significant SHOB, and continues to reports improvement in sputum production as well.   Pt is updated on the plan for today, and all questions and concerns are addressed.   Objective:  Vital signs in last 24 hours: Vitals:   04/27/21 0500 04/27/21 0600 04/27/21 0800 04/27/21 1100  BP: 125/79 124/69 136/78 139/86  Pulse: 76 86 85 96  Resp: 16 20 16    Temp: 98.4 F (36.9 C)  (!) 97.5 F (36.4 C)   TempSrc: Oral  Oral   SpO2: 97% 96% 99% 97%  Weight:      Height:        Constitutional: alert, well-appearing, in NAD Cardiovascular: RRR, no m/r/g, 1+ bilateral LE edema  Pulm: normal WOB on  2-3L, bilateral scattered end-expiratory wheezing. Abdominal: soft, non-tender to palpation, non-distended MSK: normal bulk and tone Neurological: A&O x 3 Skin: warm and dry Psych: normal behavior, normal affect   Assessment/Plan:  Principal Problem:   COPD with acute exacerbation (HCC)  Brent Good. is a 76 y.o. male with a PMHx of COPD, HTN, and GERD admitted for COPD exacerbation in setting of CAP.    Vitals stable- satting 96-97 on RA w/o desat with ambulation Blood cultures- NGTD Abx- cefdinir and doxy and prednisone for 5 days   Acute COPD exacerbation likely 2/2 CAP Improvement in [facility] and sputum production with breathing tx and abx. Leukocytosis and lactic acidosis resolved. Able to wean pt off of O2; satting at 96-97% on RA and not desaturating with ambulation. Transitioned to Cefdinir and doxy to complete 5 day course, and continue prednisone for total of 5 days.   - Cefdinir and doxy  - Prednisone  - Continue DuoNebs as needed as well as Breo Ellipta.    Lactic acidosis, resolved  Likely from albuterol use. Pt is doing well and BP is stable.    HTN Well controlled during  admission on home med -Continue amlodipine 5 mg daily   GERD -Continue Protonix 40 mg p.o. daily   BPH -Continue tamsulosin 0.8 mg p.o. daily   Tobacco use -Nicotine patch  Best Practice: Diet: Heart Healthy IVF: Lr 150 cc/hr  VTE: Enoxaparin Code: Full   [facility], MD  Internal Medicine Resident, PGY-1 Pager: [phone] After 5pm on weekdays and 1pm on weekends: On Call pager [phone]

## 2021-04-27 NOTE — Progress Notes (Signed)
Explained discharge instructions to receiving RN. No additional questions.

## 2021-04-27 NOTE — Progress Notes (Signed)
°  Date: 04/27/2021  Patient name: Brent Good.  Medical record number: [id number]  Date of birth: 1946-01-10   I have seen and evaluated [name] and discussed their care with the Residency Team.  In brief, patient is a 76 year old male with a past medical history of COPD, hypertension and GERD who presented to the ED with worsening shortness of breath over the last 3 to 4 days.  Patient states that approximately 4 days ago he noted worsening shortness of breath and a change in color of the sputum to yellow/orange.  Patient states that he felt like he could not catch his breath and also noted increased dyspnea on exertion and was not able to walk 10 to 15 feet without stopping.  Patient states that he has been compliant with his inhalers at home but these have not helped with his shortness of breath.  No chest pain, no palpitations, no lightheadedness, no syncope, no focal weakness, tingling or numbness, no abdominal pain, no nausea or vomiting, no diarrhea, no fevers or chills.  Patient came to the ED for further evaluation.  Today, patient said that his shortness of breath is much improved.  He still states that he is not at his baseline but feels better than he did when he came in.   PMHx, Fam Hx, and/or Soc Hx : As per resident admit note  Vitals:   04/27/21 0600 04/27/21 0800  BP: 124/69 136/78  Pulse: 86 85  Resp: 20 16  Temp:  (!) 97.5 F (36.4 C)  SpO2: 96% 99%   General: Awake, alert, oriented x3, NAD CVS: Regular rate and rhythm, normal heart sounds Lungs: Bilateral scattered expiratory wheeze noted on exam Abdomen: Soft, nontender, nondistended, normoactive bowel sounds Extremities: No edema noted, nontender to palpation Psych: Normal affect HEENT: Normocephalic, atraumatic Skin: Warm and dry  Assessment and Plan: I have seen and evaluated the patient as outlined above. I agree with the formulated Assessment and Plan as detailed in the residents' note, with  the following changes:   1.  Acute COPD exacerbation likely secondary to underlying CAP: -Patient presented to ED with worsening shortness of breath over the last 3 to 4 days as well as increased purulence of his sputum with no relief from his inhalers.  In the ED he was noted to have leukocytosis up to 12, increased lactic acidosis in the threes as well as increased work of breathing and shortness of breath requiring O2 supplementation.  Patient also noted to have right middle lobe infiltrate on his chest x-ray as well as scattered groundglass opacities possibly infectious/inflammatory on the CT chest.  I suspect that he has an acute on chronic COPD exacerbation secondary to an underlying pneumonia. -Patient leukocytosis and lactic acidosis have now resolved -He still has some scattered wheezes on exam but overall is much improved from admission. -We will transition patient to oral cefdinir and doxycycline to complete a 5-day course -We will attempt to wean patient off O2 today.  Currently patient's O2 sats are in the 96 to 97% range on 3 L nasal cannula -We will obtain PT/OT evaluation today -We will continue prednisone 40 mg daily to complete a 5-day course -Continue DuoNebs as needed as well as Breo Ellipta -No further work-up at this time.  If patient continues to improve I suspect that he should be stable for DC home tomorrow  Earl Lagos, MD 2/1/202311:31 AM

## 2021-04-27 NOTE — Clinical Note (Incomplete)
oxygen

## 2021-04-27 NOTE — Progress Notes (Addendum)
Ambulated the patient in the hall without oxygen. His recorded oxygen saturation was 96% ambulating. He was 97% while sitting.

## 2021-04-27 NOTE — Progress Notes (Incomplete)
Patient's sister is requesting to speak with MD before her brother is discharged tomorrow.  Please contact patient's sister Rick Duff at [phone].

## 2021-04-28 DIAGNOSIS — I10 Essential (primary) hypertension: Secondary | ICD-10-CM | POA: Diagnosis not present

## 2021-04-28 DIAGNOSIS — K219 Gastro-esophageal reflux disease without esophagitis: Secondary | ICD-10-CM | POA: Diagnosis not present

## 2021-04-28 DIAGNOSIS — J441 Chronic obstructive pulmonary disease with (acute) exacerbation: Secondary | ICD-10-CM | POA: Diagnosis not present

## 2021-04-28 DIAGNOSIS — N400 Enlarged prostate without lower urinary tract symptoms: Secondary | ICD-10-CM | POA: Diagnosis not present

## 2021-04-28 LAB — CBC
HCT: 40.6 % (ref 39.0–52.0)
Hemoglobin: 13.3 g/dL (ref 13.0–17.0)
MCH: 29.8 pg (ref 26.0–34.0)
MCHC: 32.8 g/dL (ref 30.0–36.0)
MCV: 90.8 fL (ref 80.0–100.0)
Platelets: 228 10*3/uL (ref 150–400)
RBC: 4.47 MIL/uL (ref 4.22–5.81)
RDW: 13.8 % (ref 11.5–15.5)
WBC: 9.8 10*3/uL (ref 4.0–10.5)
nRBC: 0 % (ref 0.0–0.2)

## 2021-04-28 LAB — BASIC METABOLIC PANEL
Anion gap: 10 (ref 5–15)
BUN: 20 mg/dL (ref 8–23)
CO2: 24 mmol/L (ref 22–32)
Calcium: 8.5 mg/dL — ABNORMAL LOW (ref 8.9–10.3)
Chloride: 107 mmol/L (ref 98–111)
Creatinine, Ser: 0.76 mg/dL (ref 0.61–1.24)
GFR, Estimated: >60 mL/min (ref >60)
Glucose, Bld: 93 mg/dL (ref 70–99)
Potassium: 4.3 mmol/L (ref 3.5–5.1)
Sodium: 141 mmol/L (ref 135–145)

## 2021-04-28 MED ORDER — CEFDINIR 300 MG PO CAPS: 300.0000 mg | ORAL_CAPSULE | Freq: Two times a day (BID) | ORAL | 0 refills | Status: AC

## 2021-04-28 MED ORDER — PREDNISONE 20 MG PO TABS: 40.0000 mg | ORAL_TABLET | Freq: Every day | ORAL | 0 refills | Status: AC

## 2021-04-28 MED ORDER — DOXYCYCLINE HYCLATE 100 MG PO TABS: 100.0000 mg | ORAL_TABLET | Freq: Two times a day (BID) | ORAL | 0 refills | Status: AC

## 2021-04-28 NOTE — Discharge Summary (Signed)
Name: Brent Good. MRN: [id number] DOB: 1945-08-11 76 y.o. PCP: [facility]  Date of Admission: 04/26/2021  6:41 AM Date of Discharge:  04/28/21 Attending Physician: Dr. Dareen Piano  DISCHARGE DIAGNOSIS:  Primary Problem: COPD with acute exacerbation [facility]   Hospital Problems: Principal Problem:   COPD with acute exacerbation ([place])    DISCHARGE MEDICATIONS:   Allergies as of 04/28/2021       Reactions   Azithromycin Rash        Medication List     STOP taking these medications    naproxen 500 MG tablet Commonly known as: NAPROSYN       TAKE these medications    albuterol 108 (90 Base) MCG/ACT inhaler Commonly known as: VENTOLIN HFA Inhale 2 puffs into the lungs every 6 (six) hours as needed for wheezing or shortness of breath.   amLODipine 5 MG tablet Commonly known as: NORVASC Take 5 mg by mouth daily.   budesonide-formoterol 160-4.5 MCG/ACT inhaler Commonly known as: SYMBICORT Inhale 2 puffs into the lungs 2 (two) times daily.   cefdinir 300 MG capsule Commonly known as: OMNICEF Take 1 capsule (300 mg total) by mouth every 12 (twelve) hours.   doxycycline 100 MG tablet Commonly known as: VIBRA-TABS Take 1 tablet (100 mg total) by mouth 2 (two) times daily.   gabapentin 300 MG capsule Commonly known as: NEURONTIN Take 300 mg by mouth at bedtime.   ipratropium 0.02 % nebulizer solution Commonly known as: ATROVENT Inhale 2.5 mLs (0.5 mg total) into the lungs 2 (two) times daily.   omeprazole 20 MG capsule Commonly known as: PRILOSEC Take 20 mg by mouth daily.   predniSONE 20 MG tablet Commonly known as: DELTASONE Take 2 tablets (40 mg total) by mouth daily with breakfast. Start taking on: April 29, 2021   sodium chloride 0.65 % Soln nasal spray Commonly known as: OCEAN Place 1 spray into both nostrils daily as needed for congestion.   SPIRIVA HANDIHALER IN Inhale 1 capsule into the lungs daily.   tamsulosin 0.4 MG Caps  capsule Commonly known as: FLOMAX Take 2 capsules (0.8 mg total) by mouth daily. What changed:  how much to take when to take this   VAPORIZING CHEST RUB EX Apply 1 application topically daily as needed (congestion).        DISPOSITION AND FOLLOW-UP:  Brent Good. was discharged from [facility] in stable condition. At the hospital follow up visit please address:  Follow-up Recommendations: Consults: none  Labs: none  Studies: none  Medications: Complete the course of cefdinir, doxycycline, and prednisone   Follow-up Appointments:   HOSPITAL COURSE:  Patient Summary: Acute COPD exacerbation likely secondary to underlying CAP -Patient presented to ED with worsening shortness of breath over the last 3 to 4 days as well as increased purulence of his sputum with no relief from his inhalers.  In the ED he was noted to have leukocytosis up to 12, increased lactic acidosis in the threes as well as increased work of breathing and shortness of breath requiring O2 supplementation.  Patient also noted to have right middle lobe infiltrate on his chest x-ray as well as scattered groundglass opacities possibly infectious/inflammatory on the CT chest.  I suspect that he has an acute on chronic COPD exacerbation secondary to an underlying pneumonia. -Patient leukocytosis and lactic acidosis have now resolved -Initially on ceftriaxone and doxy for 1 day, and then transitioned to oral cefdinir and doxycycline to complete a  5-day course - PT/OT evaluation  -We will continue prednisone 40 mg daily to complete a 5-day course -Continue DuoNebs as needed as well as Breo Ellipta  Lactic acidosis, resolved  Likely from albuterol use. Pt is doing well and BP is stable.    HTN Well controlled during admission on home med -Continue amlodipine 5 mg daily   GERD -Continue Protonix 40 mg p.o. daily   BPH -Continue tamsulosin 0.8 mg p.o. daily   Tobacco use -Nicotine  patch   DISCHARGE INSTRUCTIONS:   Discharge Instructions     Call MD for:  difficulty breathing, headache or visual disturbances   Complete by: As directed    Call MD for:  persistant dizziness or light-headedness   Complete by: As directed    Call MD for:  persistant nausea and vomiting   Complete by: As directed    Call MD for:  redness, tenderness, or signs of infection (pain, swelling, redness, odor or green/yellow discharge around incision site)   Complete by: As directed    Call MD for:  severe uncontrolled pain   Complete by: As directed    Call MD for:  temperature >100.4   Complete by: As directed    Diet - low sodium heart healthy   Complete by: As directed    Increase activity slowly   Complete by: As directed        SUBJECTIVE:  No acute overnight events. Patient was seen at bedside during rounds this morning. Pt reports feeling better and reports improvement in [facility] and sputum production. He denies chest pain, N/V/D, and urinary symptoms.   No other complains or concerns at this time.   All questions were addressed with patient prior to being discharged.   Discharge Vitals:   BP 137/77 (BP Location: Right Arm)    Pulse 88    Temp 98.1 F (36.7 C) (Oral)    Resp 18    Ht 5\' 8"  (1.727 m)    Wt 78.5 kg    SpO2 95%    BMI 26.30 kg/m   OBJECTIVE:    Constitutional: alert, well-appearing, in NAD Cardiovascular: RRR, no m/r/g, 1+ bilateral LE edema  Pulm: normal WOB on  2-3L, bilateral scattered end-expiratory wheezing. Abdominal: soft, non-tender to palpation, non-distended MSK: normal bulk and tone Neurological: A&O x 3 Skin: warm and dry Psych: normal behavior, normal affect  Pertinent Labs, Studies, and Procedures:  CBC Latest Ref Rng & Units 04/28/2021 04/27/2021 04/26/2021  WBC 4.0 - 10.5 K/uL 9.8 9.2 12.8(H)  Hemoglobin 13.0 - 17.0 g/dL 13.3 13.7 15.7  Hematocrit 39.0 - 52.0 % 40.6 40.1 45.9  Platelets 150 - 400 K/uL 228 214 248    CMP Latest Ref Rng &  Units 04/28/2021 04/27/2021 04/26/2021  Glucose 70 - 99 mg/dL 93 119(H) 113(H)  BUN 8 - 23 mg/dL 20 20 19   Creatinine 0.61 - 1.24 mg/dL 0.76 0.92 0.78  Sodium 135 - 145 mmol/L 141 140 140  Potassium 3.5 - 5.1 mmol/L 4.3 4.3 4.0  Chloride 98 - 111 mmol/L 107 108 106  CO2 22 - 32 mmol/L 24 26 23   Calcium 8.9 - 10.3 mg/dL 8.5(L) 8.2(L) 9.0  Total Protein 6.5 - 8.1 g/dL - - 6.7  Total Bilirubin 0.3 - 1.2 mg/dL - - 0.6  Alkaline Phos 38 - 126 U/L - - 72  AST 15 - 41 U/L - - 14(L)  ALT 0 - 44 U/L - - 12    DG Chest 2 View  Result Date: 04/26/2021 CLINICAL DATA:  76 year old male with shortness of breath. COPD exacerbation. EXAM: CHEST - 2 VIEW COMPARISON:  Chest radiographs 05/07/2017 and earlier. FINDINGS: Chronic large lung volumes. Mediastinal contours remain normal. Visualized tracheal air column is within normal limits. No pneumothorax or pulmonary edema. No pleural effusion or consolidation. Mildly increased streaky opacity in the right lower lung, appears related to the middle lobe on the lateral view. No acute osseous abnormality identified. Negative visible bowel gas. IMPRESSION: Chronic hyperinflation with mildly increased streaky right middle lobe opacity, compatible with acute infectious exacerbation. No pleural effusion. Electronically Signed   By: Genevie Ann M.D.   On: 04/26/2021 07:18   CT Angio Chest PE W/Cm &/Or Wo Cm  Result Date: 04/26/2021 CLINICAL DATA:  Chest tightness, shortness of breath EXAM: CT ANGIOGRAPHY CHEST WITH CONTRAST TECHNIQUE: Multidetector CT imaging of the chest was performed using the standard protocol during bolus administration of intravenous contrast. Multiplanar CT image reconstructions and MIPs were obtained to evaluate the vascular anatomy. This exam was performed according to the departmental dose-optimization program which includes automated exposure control, adjustment of the mA and/or kV according to patient size and/or use of iterative reconstruction  technique. RADIATION DOSE REDUCTION: This exam was performed according to the departmental dose-optimization program which includes automated exposure control, adjustment of the mA and/or kV according to patient size and/or use of iterative reconstruction technique. CONTRAST:  59mL OMNIPAQUE IOHEXOL 350 MG/ML SOLN COMPARISON:  CT abdomen 03/12/2017 FINDINGS: Cardiovascular: Heart size normal. Trace pericardial fluid. The RV is nondilated. Satisfactory opacification of pulmonary arteries noted, and there is no evidence of pulmonary emboli. Scattered coronary calcifications. Good contrast opacification of the thoracic aorta without aneurysm, dissection, or stenosis. Partially calcified atheromatous plaque in the arch and descending thoracic aorta. 3 vessel brachiocephalic arterial origin anatomy without high-grade stenosis proximally. Mediastinum/Nodes: No mass or adenopathy. Lungs/Pleura: No pleural effusion. No pneumothorax. Ill-defined scattered subcentimeter ground-glass opacities in the lung apices, lingula, right middle lobe and probably in the right lower lobe although there is significant motion degradation limiting evaluation; visualized portions of lung bases on the prior study did not demonstrate these findings. Upper Abdomen: No acute findings. Early spondylitic changes in the lower cervical spine. No worrisome bone lesion. Musculoskeletal: No chest wall abnormality. No acute or significant osseous findings. Review of the MIP images confirms the above findings. IMPRESSION: 1. Negative for acute PE or thoracic aortic dissection. 2. Coronary and Aortic Atherosclerosis (ICD10-170.0). The severity of coronary artery disease and any potential stenosis cannot be assessed on this non-gated CT examination. Assessment for potential risk factor modification, dietary therapy or pharmacologic therapy may be warranted, if clinically indicated. 3. Small scattered bilateral ground-glass opacities, nonspecific, presumably  infectious/inflammatory. Non-contrast chest CT at 3-6 months is recommended. If nodules persist, subsequent management will be based upon the most suspicious nodule(s). This recommendation follows the consensus statement: Guidelines for Management of Incidental Pulmonary Nodules Detected on CT Images:From the Fleischner Society 2017; published online before print (10.1148/radiol.SG:5268862). Electronically Signed   By: Lucrezia Europe M.D.   On: 04/26/2021 16:58      Lajean Manes, MD Internal Medicine Resident, PGY-1 Pager: [phone]

## 2021-04-28 NOTE — Discharge Instructions (Signed)
You were admitted for a COPD exacerbation likely from an underlying pneumonia. You did well with the breathing treatment and the antibiotics. Please complete the antibiotic course and steroid course and follow up with you PCP in 1 week. Continue taking your other medications as instructed in this discharge packet.

## 2021-04-28 NOTE — Evaluation (Signed)
Occupational Therapy Evaluation/Discharge Patient Details Name: Brent Good. MRN: [id number] DOB: [birth year] Today's Date: 04/28/2021   History of Present Illness Brent Good. is a 76 y.o. male admitted for acute COPD exacerbation and PNA, initially requiring supplemental O2 during admission. PMH: COPD, HTN, GERD.   Clinical Impression   PTA, pt lives alone and reports Independence in all ADLs, IADLs, driving and mobility without AD. Pt reports hx of SOB with activity though improves with inhaler use in AM. Pt presents now Independent with ADLs, in-room and hallway mobility implementing standing rest breaks as needed. Educated on energy conservation strategies to implement at home with gradual progression of activity tolerance. Encouraged pt to complete hallway mobility frequently during admission, as well as incentive spirometer use. Pt noted with wheezing with inhalation and requesting inhaler to assist with SOB after tasks - RN aware. Anticipate pt to return to PLOF quickly without skilled OT services follow-up. OT to sign off at acute level.  Spo2 93% at rest, 90% after activity on RA     Recommendations for follow up therapy are one component of a multi-disciplinary discharge planning process, led by the attending physician.  Recommendations may be updated based on patient status, additional functional criteria and insurance authorization.   Follow Up Recommendations  No OT follow up    Assistance Recommended at Discharge PRN  Patient can return home with the following      Functional Status Assessment  Patient has not had a recent decline in their functional status  Equipment Recommendations  None recommended by OT (declines shower chair)    Recommendations for Other Services       Precautions / Restrictions Precautions Precautions: Fall;Other (comment) Precaution Comments: monitor O2/SOB Restrictions Weight Bearing Restrictions: No      Mobility Bed  Mobility Overal bed mobility: Modified Independent             General bed mobility comments: HOB elevated due to difficulty laying flat d/t breathing    Transfers Overall transfer level: Independent Equipment used: None                      Balance Overall balance assessment: No apparent balance deficits (not formally assessed)                                         ADL either performed or assessed with clinical judgement   ADL Overall ADL's : Independent                                       General ADL Comments: able to don shoes, mobilize to bathroom x 2 independently, as well as hallway mobility with standing rest breaks as needed. provided energy conservation handout and education with pt reporting already implementing rest breaks, pacing, etc. Encouraged pt to complete dressing tasks, etc sitting and consider sponge bathing vs showering task on the "bad days". eductaed on benefits of pulse ox     Vision Baseline Vision/History: 1 Wears glasses Ability to See in Adequate Light: 0 Adequate Patient Visual Report: No change from baseline Vision Assessment?: No apparent visual deficits     Perception     Praxis      Pertinent Vitals/Pain Pain Assessment Pain Assessment: No/denies pain     Hand  Dominance Right   Extremity/Trunk Assessment Upper Extremity Assessment Upper Extremity Assessment: Overall WFL for tasks assessed   Lower Extremity Assessment Lower Extremity Assessment: Defer to PT evaluation   Cervical / Trunk Assessment Cervical / Trunk Assessment: Normal   Communication Communication Communication: No difficulties   Cognition Arousal/Alertness: Awake/alert Behavior During Therapy: WFL for tasks assessed/performed Overall Cognitive Status: Within Functional Limits for tasks assessed                                       General Comments       Exercises Exercises: Other  exercises Other Exercises Other Exercises: IS x 5   Shoulder Instructions      Home Living Family/patient expects to be discharged to:: Private residence Living Arrangements: Alone Available Help at Discharge: Friend(s);Family;Available PRN/intermittently Type of Home: Mobile home Home Access: Other (comment) (1/2 step) Entrance Stairs-Number of Steps: 1/2 threshold step   Home Layout: One level     Bathroom Shower/Tub: Chief Strategy Officer: Standard     Home Equipment: None          Prior Functioning/Environment Prior Level of Function : Independent/Modified Independent;Driving             Mobility Comments: no use of AD ADLs Comments: Driving,grocery shopping, and enjoys going to [employer].        OT Problem List: Cardiopulmonary status limiting activity      OT Treatment/Interventions:      OT Goals(Current goals can be found in the care plan section) Acute Rehab OT Goals Patient Stated Goal: feel better, improve breathing and go home OT Goal Formulation: All assessment and education complete, DC therapy  OT Frequency:      Co-evaluation              AM-PAC OT "6 Clicks" Daily Activity     Outcome Measure Help from another person eating meals?: None Help from another person taking care of personal grooming?: None Help from another person toileting, which includes using toliet, bedpan, or urinal?: None Help from another person bathing (including washing, rinsing, drying)?: None Help from another person to put on and taking off regular upper body clothing?: None Help from another person to put on and taking off regular lower body clothing?: None 6 Click Score: 24   End of Session Nurse Communication: Mobility status;Other (comment) (request for inhaler)  Activity Tolerance: Patient tolerated treatment well Patient left: in bed;with call bell/phone within reach  OT Visit Diagnosis: Other (comment) (decreased cardiopulmonary  tolerance)                Time: 2774-1287 OT Time Calculation (min): 18 min Charges:  OT General Charges $OT Visit: 1 Visit OT Evaluation $OT Eval Low Complexity: 1 Low  [name], OTR/L Acute Rehab Services Office: [phone]   Lorre Munroe 04/28/2021, 7:42 AM

## 2021-04-28 NOTE — Progress Notes (Signed)
PT Cancellation Note  Patient Details Name: Brent Good. MRN: [id number] DOB: [birth year]   Cancelled Treatment:    Reason Eval/Treat Not Completed: PT screened, no needs identified per OT Evaluation, will sign off.  Brent Good, PT, DPT Acute Rehabilitation Services  Pager [phone] Office [phone]  Malachy Chamber 04/28/2021, 7:36 AM

## 2021-04-28 NOTE — Progress Notes (Signed)
NCM called [facility]hr notification hotline, referral # [id number] received. Gae Gallop RN,BSN,CM

## 2021-05-01 LAB — CULTURE, BLOOD (ROUTINE X 2)
Culture: NO GROWTH
Culture: NO GROWTH

## 2021-05-09 ENCOUNTER — Emergency Department (HOSPITAL_COMMUNITY)
Admission: EM | Admit: 2021-05-09 | Discharge: 2021-05-09 | Disposition: A | Discharge status: Home / Self Care | Payer: No Typology Code available for payment source | Attending: Emergency Medicine | Admitting: Emergency Medicine

## 2021-05-09 ENCOUNTER — Emergency Department (HOSPITAL_COMMUNITY): Payer: No Typology Code available for payment source

## 2021-05-09 ENCOUNTER — Encounter (HOSPITAL_COMMUNITY): Payer: Self-pay | Admitting: *Deleted

## 2021-05-09 ENCOUNTER — Other Ambulatory Visit: Payer: Self-pay

## 2021-05-09 DIAGNOSIS — Z79899 Other long term (current) drug therapy: Secondary | ICD-10-CM | POA: Diagnosis not present

## 2021-05-09 DIAGNOSIS — J449 Chronic obstructive pulmonary disease, unspecified: Secondary | ICD-10-CM | POA: Insufficient documentation

## 2021-05-09 DIAGNOSIS — K219 Gastro-esophageal reflux disease without esophagitis: Secondary | ICD-10-CM | POA: Insufficient documentation

## 2021-05-09 DIAGNOSIS — R1013 Epigastric pain: Secondary | ICD-10-CM | POA: Diagnosis present

## 2021-05-09 DIAGNOSIS — R109 Unspecified abdominal pain: Secondary | ICD-10-CM

## 2021-05-09 LAB — COMPREHENSIVE METABOLIC PANEL
ALT: 21 U/L (ref 0–44)
AST: 19 U/L (ref 15–41)
Albumin: 2.7 g/dL — ABNORMAL LOW (ref 3.5–5.0)
Alkaline Phosphatase: 73 U/L (ref 38–126)
Anion gap: 6 (ref 5–15)
BUN: 8 mg/dL (ref 8–23)
CO2: 27 mmol/L (ref 22–32)
Calcium: 8.3 mg/dL — ABNORMAL LOW (ref 8.9–10.3)
Chloride: 106 mmol/L (ref 98–111)
Creatinine, Ser: 0.99 mg/dL (ref 0.61–1.24)
GFR, Estimated: >60 mL/min (ref >60)
Glucose, Bld: 103 mg/dL — ABNORMAL HIGH (ref 70–99)
Potassium: 4 mmol/L (ref 3.5–5.1)
Sodium: 139 mmol/L (ref 135–145)
Total Bilirubin: 0.4 mg/dL (ref 0.3–1.2)
Total Protein: 6.1 g/dL — ABNORMAL LOW (ref 6.5–8.1)

## 2021-05-09 LAB — CBC WITH DIFFERENTIAL/PLATELET
Abs Immature Granulocytes: 0.04 10*3/uL (ref 0.00–0.07)
Basophils Absolute: 0.1 10*3/uL (ref 0.0–0.1)
Basophils Relative: 1 %
Eosinophils Absolute: 0 10*3/uL (ref 0.0–0.5)
Eosinophils Relative: 0 %
HCT: 44.2 % (ref 39.0–52.0)
Hemoglobin: 14.5 g/dL (ref 13.0–17.0)
Immature Granulocytes: 0 %
Lymphocytes Relative: 8 %
Lymphs Abs: 0.9 10*3/uL (ref 0.7–4.0)
MCH: 30.3 pg (ref 26.0–34.0)
MCHC: 32.8 g/dL (ref 30.0–36.0)
MCV: 92.3 fL (ref 80.0–100.0)
Monocytes Absolute: 0.7 10*3/uL (ref 0.1–1.0)
Monocytes Relative: 7 %
Neutro Abs: 8.5 10*3/uL — ABNORMAL HIGH (ref 1.7–7.7)
Neutrophils Relative %: 84 %
Platelets: 295 10*3/uL (ref 150–400)
RBC: 4.79 MIL/uL (ref 4.22–5.81)
RDW: 13.2 % (ref 11.5–15.5)
WBC: 10.3 10*3/uL (ref 4.0–10.5)
nRBC: 0 % (ref 0.0–0.2)

## 2021-05-09 LAB — TROPONIN I (HIGH SENSITIVITY)
Troponin I (High Sensitivity): 5 ng/L (ref <18)
Troponin I (High Sensitivity): 5 ng/L (ref <18)

## 2021-05-09 MED ORDER — PANTOPRAZOLE SODIUM 40 MG IV SOLR
40.0000 mg | Freq: Once | INTRAVENOUS | Status: AC
  Administered 2021-05-09: 40 mg via INTRAVENOUS
  Filled 2021-05-09: qty 10

## 2021-05-09 MED ORDER — SUCRALFATE 1 G PO TABS
1.0000 g | ORAL_TABLET | Freq: Once | ORAL | Status: AC
  Administered 2021-05-09: 1 g via ORAL
  Filled 2021-05-09: qty 1

## 2021-05-09 MED ORDER — DICYCLOMINE HCL 10 MG/5ML PO SOLN
10.0000 mg | Freq: Once | ORAL | Status: AC
Start: 2021-05-09 — End: 2021-05-09
  Administered 2021-05-09: 10 mg via ORAL
  Filled 2021-05-09: qty 5

## 2021-05-09 MED ORDER — LIDOCAINE VISCOUS HCL 2 % MT SOLN
15.0000 mL | Freq: Once | OROMUCOSAL | Status: AC
  Administered 2021-05-09: 15 mL via ORAL
  Filled 2021-05-09: qty 15

## 2021-05-09 MED ORDER — SUCRALFATE 1 G PO TABS: 1.0000 g | ORAL_TABLET | Freq: Three times a day (TID) | ORAL | 0 refills | Status: AC

## 2021-05-09 MED ORDER — ALUM & MAG HYDROXIDE-SIMETH 200-200-20 MG/5ML PO SUSP
30.0000 mL | Freq: Once | ORAL | Status: AC
  Administered 2021-05-09: 30 mL via ORAL
  Filled 2021-05-09: qty 30

## 2021-05-09 NOTE — ED Provider Notes (Signed)
[facility] EMERGENCY DEPARTMENT Provider Note   CSN: [id number] Arrival date & time: 05/09/21  [id number]     History  Chief Complaint  Patient presents with   Abdominal Pain    Brent Good. is a 76 y.o. male with a history of COPD, SBO, AKI and GERD who presents to the ED for evaluation of abdominal bloating and epigastric pain at it after he was discharged from the hospital 1 week ago.  Patient was admitted initially for COPD exacerbation with likely CAP.  He notes his abdominal symptoms started the day he was discharged.  He notes that it feels like severe indigestion and has tried medications including Maalox without significant relief.  He denies diarrhea or constipation, however he says that as soon as he sits down, it "explodes" out of him.  He describes the pain as burning, worse after eating or when lying down.  He denies nausea, vomiting, fevers, chills.  He feels that his pneumonia and difficulty breathing have improved.   Abdominal Pain Associated symptoms: no fever, no shortness of breath and no vomiting       Home Medications Prior to Admission medications   Medication Sig Start Date End Date Taking? Authorizing Provider  sucralfate (CARAFATE) 1 g tablet Take 1 tablet (1 g total) by mouth 4 (four) times daily -  with meals and at bedtime for 5 days. 05/09/21 05/14/21 Yes Kathe Becton R, PA-C  albuterol (PROVENTIL HFA;VENTOLIN HFA) 108 (90 Base) MCG/ACT inhaler Inhale 2 puffs into the lungs every 6 (six) hours as needed for wheezing or shortness of breath.    [provider]  amLODipine (NORVASC) 5 MG tablet Take 5 mg by mouth daily.    [provider]  budesonide-formoterol (SYMBICORT) 160-4.5 MCG/ACT inhaler Inhale 2 puffs into the lungs 2 (two) times daily. 12/02/12   Vernie Shanks, MD  Camphor-Eucalyptus-Menthol (VAPORIZING CHEST RUB EX) Apply 1 application topically daily as needed (congestion).    [provider]   cefdinir (OMNICEF) 300 MG capsule Take 1 capsule (300 mg total) by mouth every 12 (twelve) hours. 04/28/21   Lajean Manes, MD  doxycycline (VIBRA-TABS) 100 MG tablet Take 1 tablet (100 mg total) by mouth 2 (two) times daily. 04/28/21   Lajean Manes, MD  gabapentin (NEURONTIN) 300 MG capsule Take 300 mg by mouth at bedtime.    [provider]  ipratropium (ATROVENT) 0.02 % nebulizer solution Inhale 2.5 mLs (0.5 mg total) into the lungs 2 (two) times daily. 03/16/17   Regalado, Belkys A, MD  omeprazole (PRILOSEC) 20 MG capsule Take 20 mg by mouth daily.    [provider]  predniSONE (DELTASONE) 20 MG tablet Take 2 tablets (40 mg total) by mouth daily with breakfast. 04/29/21   Lajean Manes, MD  sodium chloride (OCEAN) 0.65 % SOLN nasal spray Place 1 spray into both nostrils daily as needed for congestion.    [provider]  tamsulosin (FLOMAX) 0.4 MG CAPS capsule Take 2 capsules (0.8 mg total) by mouth daily. Patient taking differently: Take 0.4 mg by mouth 2 (two) times daily. 11/20/13   Waldemar Dickens, MD  Tiotropium Bromide Monohydrate (SPIRIVA HANDIHALER IN) Inhale 1 capsule into the lungs daily.     [provider]      Allergies    Azithromycin    Review of Systems   Review of Systems  Constitutional:  Negative for fever.  HENT: Negative.    Eyes: Negative.   Respiratory:  Negative for shortness of breath.   Cardiovascular: Negative.   Gastrointestinal:  Positive for abdominal distention and abdominal pain. Negative for vomiting.  Endocrine: Negative.   Genitourinary: Negative.   Musculoskeletal: Negative.   Skin:  Negative for rash.  Neurological:  Negative for headaches.  All other systems reviewed and are negative.  Physical Exam Updated Vital Signs BP 114/71    Pulse 88    Temp 97.8 F (36.6 C) (Oral)    Resp (!) 22    Ht 5\' 8"  (1.727 m)    Wt 77.1 kg    SpO2 96%    BMI 25.85 kg/m  Physical Exam Vitals and nursing note reviewed.   Constitutional:      General: He is not in acute distress.    Appearance: He is not ill-appearing.  HENT:     Head: Atraumatic.  Eyes:     Conjunctiva/sclera: Conjunctivae normal.  Cardiovascular:     Rate and Rhythm: Normal rate and regular rhythm.     Pulses: Normal pulses.     Heart sounds: No murmur heard. Pulmonary:     Effort: Pulmonary effort is normal. No respiratory distress.     Breath sounds: Normal breath sounds.  Abdominal:     General: Abdomen is flat. There is distension.     Palpations: Abdomen is soft.     Tenderness: There is no abdominal tenderness. There is no right CVA tenderness or left CVA tenderness.     Comments: Abdomen is soft, distended, nontender to palpation.  Normoactive bowel sounds.  Musculoskeletal:        General: Normal range of motion.     Cervical back: Normal range of motion.  Skin:    General: Skin is warm and dry.     Capillary Refill: Capillary refill takes less than 2 seconds.  Neurological:     General: No focal deficit present.     Mental Status: He is alert.  Psychiatric:        Mood and Affect: Mood normal.    ED Results / Procedures / Treatments   Labs (all labs ordered are listed, but only abnormal results are displayed) Labs Reviewed  COMPREHENSIVE METABOLIC PANEL - Abnormal; Notable for the following components:      Result Value   Glucose, Bld 103 (*)    Calcium 8.3 (*)    Total Protein 6.1 (*)    Albumin 2.7 (*)    All other components within normal limits  CBC WITH DIFFERENTIAL/PLATELET - Abnormal; Notable for the following components:   Neutro Abs 8.5 (*)    All other components within normal limits  URINALYSIS, ROUTINE W REFLEX MICROSCOPIC  TROPONIN I (HIGH SENSITIVITY)  TROPONIN I (HIGH SENSITIVITY)    EKG EKG Interpretation  Date/Time:  Monday May 09 2021 07:32:44 EST Ventricular Rate:  103 PR Interval:  134 QRS Duration: 86 QT Interval:  320 QTC Calculation: 419 R Axis:   87 Text  Interpretation: Sinus tachycardia Right atrial enlargement Borderline right axis deviation No significant change since last tracing Confirmed by Orpah Greek [phone]) on 05/09/2021 7:35:59 AM  Radiology DG CHEST PORT 1 VIEW  Result Date: 05/09/2021 CLINICAL DATA:  Abdominal pain and bloating since last Thursday, worsened pain after eating. History of pneumonia with recent hospitalization EXAM: PORTABLE CHEST 1 VIEW COMPARISON:  Portable exam 0802 hours compared to 04/26/2021 FINDINGS: Normal heart size, mediastinal contours, and pulmonary vascularity. Lungs clear. No pulmonary infiltrate, pleural effusion, or pneumothorax. Bones demineralized. IMPRESSION: No acute  abnormalities. Electronically Signed   By: Lavonia Dana M.D.   On: 05/09/2021 08:20   DG Abd Portable 2 Views  Result Date: 05/09/2021 CLINICAL DATA:  Abdominal pain and bloating since last Thursday worsened by eating EXAM: PORTABLE ABDOMEN - 2 VIEW COMPARISON:  Portable exam 0804 hours compared to 03/14/2017 and correlated with CT abdomen and pelvis 03/12/2017 FINDINGS: Normal bowel gas pattern. No bowel dilatation bowel wall thickening. Multiple LEFT renal calculi largest 8 mm. Rounded soft tissue fullness at expected position of inferior pole LEFT kidney, patient with a renal cyst at this site on prior CT suspect increased in size. Lung bases clear. Degenerative disc disease changes with scoliosis lumbar spine. Coils in pelvis question prior hernia repair. IMPRESSION: Nonobstructive bowel gas pattern. LEFT renal calculi. Soft tissue density question enlarging cyst at inferior pole LEFT kidney, can assess by follow-up renal ultrasound. Electronically Signed   By: Lavonia Dana M.D.   On: 05/09/2021 08:23    Procedures Procedures    Medications Ordered in ED Medications  pantoprazole (PROTONIX) injection 40 mg (40 mg Intravenous Given 05/09/21 0842)  sucralfate (CARAFATE) tablet 1 g (1 g Oral Given 05/09/21 0842)  dicyclomine  (BENTYL) 10 MG/5ML solution 10 mg (10 mg Oral Given 05/09/21 1125)  alum & mag hydroxide-simeth (MAALOX/MYLANTA) 200-200-20 MG/5ML suspension 30 mL (30 mLs Oral Given 05/09/21 0934)    And  lidocaine (XYLOCAINE) 2 % viscous mouth solution 15 mL (15 mLs Oral Given 05/09/21 [id number])    ED Course/ Medical Decision Making/ A&P                           Medical Decision Making Amount and/or Complexity of Data Reviewed Labs: ordered. Radiology: ordered.  Risk OTC drugs. Prescription drug management.   History:  Per HPI  Initial impression:  This patient presents to the ED for concern of epigastric pain and bloating, this involves an extensive number of treatment options, and is a complaint that carries with it a high risk of complications and morbidity.   The differential diagnosis for generalized abdominal pain includes, but is not limited to AAA, gastroenteritis, appendicitis, Bowel obstruction, Bowel perforation. Gastroparesis, DKA, Hernia, Inflammatory bowel disease, mesenteric ischemia, pancreatitis, peritonitis SBP, volvulus.   ED Course: Patient is in no acute distress, nontoxic-appearing.  Physical exam significant for abdominal distention without tenderness or loss of bowel sounds.  Given his history of SBO, will obtain abdominal x-rays.  We will also recheck chest x-rays for recent pneumonia and labs. Chest x-ray without any acute abnormalities.  Abdominal x-ray shows nonobstructive bowel gas pattern.  Patient states that they have gone to other ED's in the past for this symptom and he has been given Protonix and Carafate with significant improvement.  I give patient Protonix and Carafate without any change in symptoms.  I then ordered a GI cocktail of Maalox, viscous lidocaine and Bentyl with significant improvement in symptoms.  Chest x-ray without any signs of pneumonia.  Remainder of labs were nonconcerning. Patient feels better and is ready for discharge.  Lab Tests and EKG:  I  Ordered, reviewed, and interpreted labs and EKG.  The pertinent results include:  CMP, CBC unremarkable, troponin normal   Imaging Studies ordered:  I ordered imaging studies including chest x-ray and abdominal x-ray I independently visualized and interpreted imaging and I agree with the radiologist interpretation.    Cardiac Monitoring:  The patient was maintained on a cardiac monitor.  I personally  viewed and interpreted the cardiac monitored which showed an underlying rhythm of: NSR   Medicines ordered and prescription drug management:  I ordered medication including: Protonix 40 mg IV and Carafate 1 g p.o. for abdominal bloating and epigastric pain GI cocktail Bentyl Reevaluation of the patient after these medicines showed that the patient improved I have reviewed the patients home medicines and have made adjustments as needed   Disposition:  After consideration of the diagnostic results, physical exam, history and the patients response to treatment feel that the patent would benefit from discharge with outpatient follow-up.   GERD: Low concern for SBO given nonobstructive pattern on abdominal x-ray.  I do not believe this is a heart pathology given his normal troponins.  He is without leukocytosis indicating significant infection.  Physical exam without any tenderness that would be concerning for cholecystitis.  This is likely a flareup of his GERD and he responded well to GI cocktail along with Protonix and Carafate here in the emergency department.  He is to follow-up outpatient with his PCP as needed.  Return precautions were discussed.  All questions asked and answered and he was discharged home in good condition.    Final Clinical Impression(s) / ED Diagnoses Final diagnoses:  Gastroesophageal reflux disease without esophagitis    Rx / DC Orders ED Discharge Orders          Ordered    sucralfate (CARAFATE) 1 g tablet  3 times daily with meals & bedtime         05/09/21 1128              Tonye Pearson, [place] 05/09/21 1135    Fredia Sorrow, MD 05/12/21 2352

## 2021-05-09 NOTE — ED Triage Notes (Signed)
Patient presents to ed c/o bloating in abd. States he was admitted to the hospital 1 1/2  weeks ago for similar complaint and was dx. With pneumonia . States pain is worse after eating states his indigestion at night is worse.,

## 2021-05-09 NOTE — Discharge Instructions (Addendum)
Your work-up today was overall reassuring.  It appears your pneumonia has responded well to treatment and is not visible on x-ray.  Additionally, that the x-rays of your abdomen show a lot of bowel gas, however it does not appear to have any obstruction.  He seemed to feel better after the GI cocktail we repaired for you here.  I have sent you home with a prescription for a few days of Carafate which she can take until symptoms are relieved.  I have also sent you in a prescription for a few tablets of Protonix that you can take as needed.  Please note that if you take the Protonix,  do not take the Prilosec as these work very similarly.  Follow-up with your primary care doctor as needed.  Return to the emergency department if you have sudden worsening of symptoms, bloating with loss of bowel function or vomiting.

## 2021-05-09 NOTE — ED Notes (Signed)
Pt d/c home per MD order. Discharge summary reviewed, pt verbalizes understanding. Off unit via WC . No s/s of acute distress noted. Discharged home with visitor.
